# Patient Record
Sex: Female | Born: 1945 | Race: Black or African American | Hispanic: No | Smoking: Never smoker
Health system: Southern US, Community
[De-identification: ages and names within clinical notes are randomized; demographics above are authoritative.]

## PROBLEM LIST (undated history)

## (undated) DIAGNOSIS — D892 Hypergammaglobulinemia, unspecified: Secondary | ICD-10-CM

## (undated) DIAGNOSIS — E7800 Pure hypercholesterolemia, unspecified: Secondary | ICD-10-CM

## (undated) DIAGNOSIS — E079 Disorder of thyroid, unspecified: Secondary | ICD-10-CM

## (undated) DIAGNOSIS — I10 Essential (primary) hypertension: Secondary | ICD-10-CM

## (undated) DIAGNOSIS — K760 Fatty (change of) liver, not elsewhere classified: Secondary | ICD-10-CM

## (undated) DIAGNOSIS — E785 Hyperlipidemia, unspecified: Secondary | ICD-10-CM

## (undated) DIAGNOSIS — E119 Type 2 diabetes mellitus without complications: Secondary | ICD-10-CM

## (undated) DIAGNOSIS — E559 Vitamin D deficiency, unspecified: Secondary | ICD-10-CM

## (undated) DIAGNOSIS — M8580 Other specified disorders of bone density and structure, unspecified site: Secondary | ICD-10-CM

## (undated) DIAGNOSIS — R011 Cardiac murmur, unspecified: Secondary | ICD-10-CM

## (undated) HISTORY — DX: Disorder of thyroid, unspecified: E07.9

## (undated) HISTORY — PX: ANKLE FRACTURE SURGERY: SHX122

---

## 2005-05-09 ENCOUNTER — Emergency Department: Payer: Self-pay | Admitting: Internal Medicine

## 2008-10-15 ENCOUNTER — Ambulatory Visit: Payer: Self-pay

## 2010-11-28 ENCOUNTER — Other Ambulatory Visit: Payer: Self-pay | Admitting: Internal Medicine

## 2011-07-26 ENCOUNTER — Other Ambulatory Visit: Payer: Self-pay | Admitting: Internal Medicine

## 2011-07-26 LAB — ALT: SGPT (ALT): 36 U/L

## 2011-07-26 LAB — SGOT (AST)(ARMC): SGOT(AST): 34 U/L (ref 15–37)

## 2011-08-25 ENCOUNTER — Ambulatory Visit: Payer: Self-pay | Admitting: Internal Medicine

## 2012-03-28 ENCOUNTER — Other Ambulatory Visit: Payer: Self-pay | Admitting: Internal Medicine

## 2012-03-28 LAB — COMPREHENSIVE METABOLIC PANEL
Alkaline Phosphatase: 124 U/L (ref 50–136)
Bilirubin,Total: 0.5 mg/dL (ref 0.2–1.0)
Calcium, Total: 9.2 mg/dL (ref 8.5–10.1)
Chloride: 108 mmol/L — ABNORMAL HIGH (ref 98–107)
Co2: 24 mmol/L (ref 21–32)
Creatinine: 0.78 mg/dL (ref 0.60–1.30)
EGFR (Non-African Amer.): >60
Glucose: 79 mg/dL (ref 65–99)
Osmolality: 279 (ref 275–301)
SGPT (ALT): 30 U/L (ref 12–78)
Sodium: 140 mmol/L (ref 136–145)

## 2012-03-28 LAB — CBC WITH DIFFERENTIAL/PLATELET
Basophil %: 0.7 %
Eosinophil #: 0.1 10*3/uL (ref 0.0–0.7)
Eosinophil %: 0.9 %
HCT: 43.3 % (ref 35.0–47.0)
Lymphocyte #: 2.9 10*3/uL (ref 1.0–3.6)
MCH: 31.1 pg (ref 26.0–34.0)
MCHC: 33.2 g/dL (ref 32.0–36.0)
MCV: 94 fL (ref 80–100)
Monocyte #: 0.6 x10 3/mm (ref 0.2–0.9)
Neutrophil #: 4 10*3/uL (ref 1.4–6.5)
Neutrophil %: 52 %
Platelet: 195 10*3/uL (ref 150–440)
RBC: 4.61 10*6/uL (ref 3.80–5.20)

## 2012-03-28 LAB — LIPID PANEL
HDL Cholesterol: 68 mg/dL — ABNORMAL HIGH (ref 40–60)
Triglycerides: 69 mg/dL (ref 0–200)
VLDL Cholesterol, Calc: 14 mg/dL (ref 5–40)

## 2012-05-24 ENCOUNTER — Ambulatory Visit: Payer: Self-pay | Admitting: Internal Medicine

## 2012-05-24 LAB — COMPREHENSIVE METABOLIC PANEL
Albumin: 3.9 g/dL (ref 3.4–5.0)
Anion Gap: 5 — ABNORMAL LOW (ref 7–16)
BUN: 14 mg/dL (ref 7–18)
Co2: 28 mmol/L (ref 21–32)
EGFR (African American): >60
EGFR (Non-African Amer.): >60
Glucose: 91 mg/dL (ref 65–99)
Osmolality: 279 (ref 275–301)
Potassium: 3.5 mmol/L (ref 3.5–5.1)
Sodium: 140 mmol/L (ref 136–145)
Total Protein: 9.1 g/dL — ABNORMAL HIGH (ref 6.4–8.2)

## 2012-06-07 ENCOUNTER — Other Ambulatory Visit: Payer: Self-pay | Admitting: Internal Medicine

## 2012-06-08 LAB — PROT IMMUNOELECTROPHORES(ARMC)

## 2012-06-08 LAB — URINE IEP, RANDOM

## 2012-06-29 ENCOUNTER — Ambulatory Visit: Payer: Self-pay | Admitting: Oncology

## 2012-07-03 LAB — PROT IMMUNOELECTROPHORES(ARMC)

## 2012-07-18 ENCOUNTER — Ambulatory Visit: Payer: Self-pay | Admitting: Oncology

## 2012-10-11 ENCOUNTER — Other Ambulatory Visit: Payer: Self-pay | Admitting: Internal Medicine

## 2012-10-12 ENCOUNTER — Ambulatory Visit: Payer: Self-pay | Admitting: Oncology

## 2012-10-12 LAB — COMPREHENSIVE METABOLIC PANEL
Albumin: 3.8 g/dL (ref 3.4–5.0)
Alkaline Phosphatase: 100 U/L (ref 50–136)
Anion Gap: 9 (ref 7–16)
BUN: 11 mg/dL (ref 7–18)
Calcium, Total: 9.3 mg/dL (ref 8.5–10.1)
Co2: 25 mmol/L (ref 21–32)
Creatinine: 0.88 mg/dL (ref 0.60–1.30)
EGFR (Non-African Amer.): >60
Glucose: 91 mg/dL (ref 65–99)
Osmolality: 282 (ref 275–301)
Potassium: 3.4 mmol/L — ABNORMAL LOW (ref 3.5–5.1)
SGOT(AST): 34 U/L (ref 15–37)
SGPT (ALT): 38 U/L (ref 12–78)
Sodium: 142 mmol/L (ref 136–145)

## 2012-10-12 LAB — LIPID PANEL
Cholesterol: 148 mg/dL (ref 0–200)
Ldl Cholesterol, Calc: 64 mg/dL (ref 0–100)
Triglycerides: 85 mg/dL (ref 0–200)

## 2012-10-12 LAB — HEMOGLOBIN A1C: Hemoglobin A1C: 7.6 % — ABNORMAL HIGH (ref 4.2–6.3)

## 2013-08-21 ENCOUNTER — Ambulatory Visit: Payer: Self-pay | Admitting: Family Medicine

## 2013-10-01 ENCOUNTER — Ambulatory Visit: Payer: Self-pay | Admitting: Unknown Physician Specialty

## 2013-10-03 LAB — PATHOLOGY REPORT

## 2013-10-17 ENCOUNTER — Ambulatory Visit: Payer: Self-pay | Admitting: Family Medicine

## 2014-05-14 ENCOUNTER — Inpatient Hospital Stay: Payer: Self-pay | Admitting: Orthopedic Surgery

## 2014-05-14 LAB — COMPREHENSIVE METABOLIC PANEL
ALBUMIN: 3.3 g/dL — AB (ref 3.4–5.0)
ALK PHOS: 121 U/L — AB (ref 46–116)
ALT: 56 U/L (ref 14–63)
Anion Gap: 9 (ref 7–16)
BILIRUBIN TOTAL: 0.5 mg/dL (ref 0.2–1.0)
BUN: 14 mg/dL (ref 7–18)
CALCIUM: 9 mg/dL (ref 8.5–10.1)
Chloride: 104 mmol/L (ref 98–107)
Co2: 26 mmol/L (ref 21–32)
Creatinine: 0.94 mg/dL (ref 0.60–1.30)
EGFR (African American): >60
EGFR (Non-African Amer.): >60
GLUCOSE: 299 mg/dL — AB (ref 65–99)
OSMOLALITY: 289 (ref 275–301)
POTASSIUM: 4 mmol/L (ref 3.5–5.1)
SGOT(AST): 71 U/L — ABNORMAL HIGH (ref 15–37)
SODIUM: 139 mmol/L (ref 136–145)
Total Protein: 8.4 g/dL — ABNORMAL HIGH (ref 6.4–8.2)

## 2014-05-14 LAB — APTT: ACTIVATED PTT: 24.8 s (ref 23.6–35.9)

## 2014-05-14 LAB — PROTIME-INR
INR: 1
Prothrombin Time: 13 secs (ref 11.5–14.7)

## 2014-05-14 LAB — CBC
HCT: 42.8 % (ref 35.0–47.0)
HGB: 14 g/dL (ref 12.0–16.0)
MCH: 30.3 pg (ref 26.0–34.0)
MCHC: 32.7 g/dL (ref 32.0–36.0)
MCV: 93 fL (ref 80–100)
PLATELETS: 161 10*3/uL (ref 150–440)
RBC: 4.62 10*6/uL (ref 3.80–5.20)
RDW: 13.8 % (ref 11.5–14.5)
WBC: 7.2 10*3/uL (ref 3.6–11.0)

## 2014-05-14 LAB — MAGNESIUM: MAGNESIUM: 1.7 mg/dL — AB

## 2014-05-14 LAB — LIPASE, BLOOD: Lipase: 140 U/L (ref 73–393)

## 2014-05-15 LAB — CBC WITH DIFFERENTIAL/PLATELET
Basophil #: 0 10*3/uL (ref 0.0–0.1)
Basophil %: 0.3 %
EOS ABS: 0.1 10*3/uL (ref 0.0–0.7)
EOS PCT: 1.1 %
HCT: 36 % (ref 35.0–47.0)
HGB: 12 g/dL (ref 12.0–16.0)
LYMPHS PCT: 28.1 %
Lymphocyte #: 2.3 10*3/uL (ref 1.0–3.6)
MCH: 31 pg (ref 26.0–34.0)
MCHC: 33.3 g/dL (ref 32.0–36.0)
MCV: 93 fL (ref 80–100)
Monocyte #: 0.9 x10 3/mm (ref 0.2–0.9)
Monocyte %: 10.9 %
NEUTROS ABS: 4.9 10*3/uL (ref 1.4–6.5)
NEUTROS PCT: 59.6 %
Platelet: 140 10*3/uL — ABNORMAL LOW (ref 150–440)
RBC: 3.87 10*6/uL (ref 3.80–5.20)
RDW: 14 % (ref 11.5–14.5)
WBC: 8.2 10*3/uL (ref 3.6–11.0)

## 2014-05-15 LAB — BASIC METABOLIC PANEL
Anion Gap: 8 (ref 7–16)
BUN: 11 mg/dL (ref 7–18)
CALCIUM: 8 mg/dL — AB (ref 8.5–10.1)
Chloride: 108 mmol/L — ABNORMAL HIGH (ref 98–107)
Co2: 25 mmol/L (ref 21–32)
Creatinine: 0.97 mg/dL (ref 0.60–1.30)
Glucose: 228 mg/dL — ABNORMAL HIGH (ref 65–99)
Osmolality: 288 (ref 275–301)
Potassium: 3.5 mmol/L (ref 3.5–5.1)
Sodium: 141 mmol/L (ref 136–145)

## 2014-08-18 NOTE — Discharge Summary (Signed)
PATIENT NAME:  [name] MR#:  [id number] DATE OF BIRTH:  09-Sep-1945  DATE OF ADMISSION:  05/14/2014 DATE OF DISCHARGE:  05/16/2014  ADMITTING DIAGNOSIS: Left bimalleolar ankle fracture dislocation.   DISCHARGE DIAGNOSIS: Left bimalleolar ankle fracture dislocation.   OPERATION: On 05/14/2014, the patient had an ORIF of a left ankle bimalleolar fracture.   SURGEON: Laurene Footman, MD   ANESTHESIA: Spinal.   ESTIMATED BLOOD LOSS: 25 mL.   IMPLANTS USED: Synthes one-third tubular plate with multiple cortical screws, 4.0 cannulated screws medially. The patient was stabilized, brought to the recovery room, then brought down to the orthopedic floor.   HISTORY OF PRESENT ILLNESS: The patient is a 69 year old female that presented after falling at work and being unable to put pressure on that leg. The patient went to the Emergency Room, where x-rays and a CT scan were ordered, revealing a bimalleolar ankle fracture dislocation.   PHYSICAL EXAMINATION: GENERAL: Alert female, in mild discomfort.  LUNGS: Clear.  HEART: Regular rate and rhythm.  MUSCULOSKELETAL: In regard to the left leg, the patient is in a splint with palpable pulse and sensation intact. X-rays revealed a syndesmosis rupture with bimalleolar ankle fracture. The patient was stabilized and had surgery that same day. The patient was then brought to the orthopedic floor.   HOSPITAL COURSE: After initial admission, the patient was brought to the orthopedic floor after surgery. The patient worked with physical therapy, initially ambulating 40 feet and able to walk around with a walker, doing toe-touch weight-bear on the affected leg. The patient was ready to go home, on 05/16/2014, with family.   CONDITION AT DISCHARGE: Stable.   DISPOSITION: The patient was sent home.   DISCHARGE INSTRUCTIONS: The patient will do toe-touch weight-bear with a walker. The patient does have a diabetic diet. The patient will use ice pack to  decrease swelling and try to keep her leg elevated, as well as to keep her dressing on from surgery. The patient will call the clinic if there is any bright red bleeding, calf pain, bowel or bladder difficulty, or any fever greater than 101.5. The patient will follow up in 3-5 days for dressing change.   DISCHARGE MEDICATIONS: To resume home medication and to add oxycodone 1 tablet every 4 hours as needed for severe pain and aspirin 325 mg 1 tablet daily.    ____________________________ Lenna Sciara. Reche Dixon, [place] [email or web address]:[id number] D: 05/16/2014 06:30:27 ET T: 05/16/2014 11:41:47 ET JOB#: [id number]  cc: J. Reche Dixon, [place], <Dictator> J Kai Cal[facility] PA ELECTRONICALLY SIGNED 05/17/2014 10:21

## 2014-08-18 NOTE — H&P (Signed)
PATIENT NAME:  [name], LAROCHE MR#:  [id number] DATE OF BIRTH:  09/16/1945  DATE OF ADMISSION:  05/14/2014  CHIEF COMPLAINT: Severe left ankle pain.   HISTORY OF PRESENT ILLNESS: The patient is a 69 year old, who fell at work. She works at [employer]. She suffered a twisting injury to her left ankle and had immediate pain and deformity. She was brought to the Emergency Room where x-rays were obtained. The fracture was reduced on a CT scan taken followup. She is being admitted for treatment of this.   PAST MEDICAL HISTORY: Remarkable for insulin-dependent diabetes for about 10 years, as well as hypertension and thyroid disease.   MEDICATIONS ON ADMISSION: Novolin 70/30 at 30 units subcutaneously b.i.d. with meals, hydrochlorothiazide 25 daily, glipizide extended release 10 mg daily.   ALLERGIES: ACE INHIBITOR.   SOCIAL HISTORY: She is a nonsmoker and independent.   REVIEW OF SYSTEMS: Negative for chest pain or loss of consciousness.   PHYSICAL EXAMINATION:  GENERAL: Well developed, well nourished black female, who appears younger than her stated age, in mild distress.  HEENT: Remarkable for a partial denture not currently in.  LUNGS: Clear.  HEART: Regular rate and rhythm. ABDOMEN: Soft, nontender.  EXTREMITIES: Remarkable for the left leg in a splint. She has a palpable dorsalis pedis pulse through the splint. Sensation is intact to the foot with brisk capillary refill.   RADIOLOGIC DATA: X-rays were reviewed and they show a syndesmosis rupture with a distal fibular shaft fracture, as well as an avulsed medial malleolus fracture.   IMPRESSION: Bimalleolar fracture-dislocation with good initial reduction by the Emergency Room staff.   PLAN: The plan is for definitive ORIF. The risks, benefits, and possible complications were discussed with the patient.   ____________________________ Laurene Footman, MD mjm:JT D: 05/14/2014 12:18:20 ET T: 05/14/2014 12:32:46  ET JOB#: [id number]  cc: Laurene Footman, MD, <Dictator>  Laurene Footman MD ELECTRONICALLY SIGNED 05/14/2014 14:39

## 2014-08-18 NOTE — Op Note (Signed)
PATIENT NAME:  [name], FUENTE MR#:  [id number] DATE OF BIRTH:  26-Feb-1946  DATE OF PROCEDURE:  05/14/2014  PREOPERATIVE DIAGNOSIS:  Left bimalleolar ankle fracture-dislocation.   POSTOPERATIVE DIAGNOSIS:  Left bimalleolar ankle fracture-dislocation.   PROCEDURE:  Open reduction and internal fixation of distal fibula, syndesmosis, and medial malleolus.   ANESTHESIA:  Spinal.   SURGEON:  Laurene Footman, MD   DESCRIPTION OF PROCEDURE:  The patient was brought to the operating room, and after adequate anesthesia was obtained, the left leg was prepped and draped in the usual sterile fashion with a bump underneath the left buttock. A tourniquet was applied to the upper thigh. After prepping and draping in the usual sterile fashion, appropriate patient identification and timeout procedures were completed, and the tourniquet was raised to 300 mmHg. A lateral approach was made to the fibula, the fibula exposed, fracture hematoma evacuated, and there was a large amount of hematoma present in the syndesmosis. This was irrigated. The fracture was reduced anatomically using a reduction clamp and an anterior-to-posterior lag screw was inserted, over-drilling the proximal cortex and placing a cortical screw. This gave anatomic fixation of the distal fibula. A 6-hole one-third tubular plate was then used to neutralize this with 3 proximal cortical screws, 2 distal cortical screws, and 1 syndesmotic screw going across 4 cortices. This gave anatomic alignment to the ankle mortise. To stress views, the mortise was intact. The wound was irrigated, and then the anterior medial approach was made to the medial malleolus. There was significant soft tissue stripping off the periosteum anteriorly. The wound was irrigated, but no loose fragments were identified. The fracture was reduced and held in a reduced position with a dental pick as a single threaded K wire was placed, drilled, and then a 4.0 cannulated screw was placed  over this 34 mm in length. This gave good compression of the fracture with anatomic alignment. The wounds were irrigated and closed with 3-0 Vicryl subcutaneously and skin staples. Xeroform, 4 x 4's, Webril, stirrup splint, and Ace wrap were applied. The tourniquet was let down at the close of the case.   TOURNIQUET TIME:  42 minutes.   ESTIMATED BLOOD LOSS:  25 mL.   COMPLICATIONS:  None.   SPECIMEN:  None.   IMPLANTS:  Synthes one-third tubular plate with multiple cortical screws, 4.0 cannulated screws medially.    ____________________________ Laurene Footman, MD mjm:nb D: 05/14/2014 21:30:54 ET T: 05/14/2014 23:51:42 ET JOB#: [id number]  cc: Laurene Footman, MD, <Dictator> Laurene Footman MD ELECTRONICALLY SIGNED 05/15/2014 7:16

## 2014-08-22 ENCOUNTER — Other Ambulatory Visit: Payer: Self-pay | Admitting: Family Medicine

## 2014-08-22 DIAGNOSIS — Z780 Asymptomatic menopausal state: Secondary | ICD-10-CM

## 2014-09-11 ENCOUNTER — Ambulatory Visit
Admission: RE | Admit: 2014-09-11 | Discharge: 2014-09-11 | Disposition: A | Discharge status: Home / Self Care | Payer: Medicare Other | Source: Ambulatory Visit | Attending: Family Medicine | Admitting: Family Medicine

## 2014-09-11 DIAGNOSIS — Z780 Asymptomatic menopausal state: Secondary | ICD-10-CM | POA: Diagnosis present

## 2014-09-11 DIAGNOSIS — M8580 Other specified disorders of bone density and structure, unspecified site: Secondary | ICD-10-CM | POA: Diagnosis not present

## 2014-09-12 ENCOUNTER — Encounter
Admission: RE | Admit: 2014-09-12 | Discharge: 2014-09-12 | Disposition: A | Discharge status: Home / Self Care | Payer: Worker's Compensation | Source: Ambulatory Visit | Attending: Orthopedic Surgery | Admitting: Orthopedic Surgery

## 2014-09-12 DIAGNOSIS — M25572 Pain in left ankle and joints of left foot: Secondary | ICD-10-CM | POA: Insufficient documentation

## 2014-09-12 DIAGNOSIS — Z01810 Encounter for preprocedural cardiovascular examination: Secondary | ICD-10-CM | POA: Diagnosis not present

## 2014-09-12 DIAGNOSIS — Z01812 Encounter for preprocedural laboratory examination: Secondary | ICD-10-CM | POA: Insufficient documentation

## 2014-09-12 HISTORY — DX: Essential (primary) hypertension: I10

## 2014-09-12 HISTORY — DX: Type 2 diabetes mellitus without complications: E11.9

## 2014-09-12 HISTORY — DX: Hyperlipidemia, unspecified: E78.5

## 2014-09-12 LAB — CBC
HCT: 42.5 % (ref 35.0–47.0)
Hemoglobin: 14 g/dL (ref 12.0–16.0)
MCH: 30.1 pg (ref 26.0–34.0)
MCHC: 32.9 g/dL (ref 32.0–36.0)
MCV: 91.5 fL (ref 80.0–100.0)
PLATELETS: 171 10*3/uL (ref 150–440)
RBC: 4.65 MIL/uL (ref 3.80–5.20)
RDW: 14.5 % (ref 11.5–14.5)
WBC: 6.8 10*3/uL (ref 3.6–11.0)

## 2014-09-12 LAB — BASIC METABOLIC PANEL
ANION GAP: 9 (ref 5–15)
BUN: 12 mg/dL (ref 6–20)
CO2: 26 mmol/L (ref 22–32)
Calcium: 9.5 mg/dL (ref 8.9–10.3)
Chloride: 106 mmol/L (ref 101–111)
Creatinine, Ser: 0.92 mg/dL (ref 0.44–1.00)
GFR calc Af Amer: >60 mL/min (ref >60)
Glucose, Bld: 222 mg/dL — ABNORMAL HIGH (ref 65–99)
Potassium: 3.9 mmol/L (ref 3.5–5.1)
SODIUM: 141 mmol/L (ref 135–145)

## 2014-09-12 NOTE — Patient Instructions (Signed)
  Your procedure is scheduled on: Tuesday 09/17/2014 Report to Day Surgery. Enter through the [facility] To find out your arrival time please call [phone] between 1PM - 3PM on Friday 09/13/14.  Remember: Instructions that are not followed completely may result in serious medical risk, up to and including death, or upon the discretion of your surgeon and anesthesiologist your surgery may need to be rescheduled.    __x__ 1. Do not eat food or drink liquids after midnight. No gum chewing or hard candies.     __x__ 2. No Alcohol for 24 hours before or after surgery.   ____ 3. Bring all medications with you on the day of surgery if instructed.    __x__ 4. Notify your doctor if there is any change in your medical condition     (cold, fever, infections).     Do not wear jewelry, make-up, hairpins, clips or nail polish.  Do not wear lotions, powders, or perfumes. You may wear deodorant.  Do not shave 48 hours prior to surgery. Men may shave face and neck.  Do not bring valuables to the hospital.    [facility] is not responsible for any belongings or valuables.               Contacts, dentures or bridgework may not be worn into surgery.  Leave your suitcase in the car. After surgery it may be brought to your room.  For patients admitted to the hospital, discharge time is determined by your                treatment team.   Patients discharged the day of surgery will not be allowed to drive home.   Please read over the following fact sheets that you were given:   Surgical Site Infection Prevention   __x__ Take these medicines the morning of surgery with A SIP OF WATER:    1.  2.   3.   4.  5.  6.  ____ Fleet Enema (as directed)   __x__ Use CHG Soap as directed  ____ Use inhalers on the day of surgery  ____ Stop metformin 2 days prior to surgery    __x__ Take 1/2 of usual insulin dose the night before surgery and none on the morning of surgery.   __x__ Stop  Coumadin/Plavix/aspirin on     today  ____ Stop Anti-inflammatories on    ____ Stop supplements until after surgery.    ____ Bring C-Pap to the hospital.

## 2014-09-17 ENCOUNTER — Encounter
Admission: RE | Disposition: A | Discharge status: Home / Self Care | Payer: Self-pay | Source: Ambulatory Visit | Attending: Orthopedic Surgery

## 2014-09-17 ENCOUNTER — Ambulatory Visit: Payer: Worker's Compensation | Admitting: Anesthesiology

## 2014-09-17 ENCOUNTER — Ambulatory Visit
Admission: RE | Admit: 2014-09-17 | Discharge: 2014-09-17 | Disposition: A | Discharge status: Home / Self Care | Payer: Worker's Compensation | Source: Ambulatory Visit | Attending: Orthopedic Surgery | Admitting: Orthopedic Surgery

## 2014-09-17 ENCOUNTER — Encounter: Payer: Self-pay | Admitting: Anesthesiology

## 2014-09-17 DIAGNOSIS — I10 Essential (primary) hypertension: Secondary | ICD-10-CM | POA: Insufficient documentation

## 2014-09-17 DIAGNOSIS — Z79891 Long term (current) use of opiate analgesic: Secondary | ICD-10-CM | POA: Diagnosis not present

## 2014-09-17 DIAGNOSIS — Z794 Long term (current) use of insulin: Secondary | ICD-10-CM | POA: Diagnosis not present

## 2014-09-17 DIAGNOSIS — Z888 Allergy status to other drugs, medicaments and biological substances status: Secondary | ICD-10-CM | POA: Insufficient documentation

## 2014-09-17 DIAGNOSIS — Z79899 Other long term (current) drug therapy: Secondary | ICD-10-CM | POA: Insufficient documentation

## 2014-09-17 DIAGNOSIS — Z7982 Long term (current) use of aspirin: Secondary | ICD-10-CM | POA: Insufficient documentation

## 2014-09-17 DIAGNOSIS — T8484XA Pain due to internal orthopedic prosthetic devices, implants and grafts, initial encounter: Secondary | ICD-10-CM | POA: Diagnosis present

## 2014-09-17 DIAGNOSIS — E119 Type 2 diabetes mellitus without complications: Secondary | ICD-10-CM | POA: Insufficient documentation

## 2014-09-17 DIAGNOSIS — E785 Hyperlipidemia, unspecified: Secondary | ICD-10-CM | POA: Insufficient documentation

## 2014-09-17 DIAGNOSIS — R011 Cardiac murmur, unspecified: Secondary | ICD-10-CM | POA: Diagnosis not present

## 2014-09-17 DIAGNOSIS — R413 Other amnesia: Secondary | ICD-10-CM | POA: Diagnosis not present

## 2014-09-17 HISTORY — PX: HARDWARE REMOVAL: SHX979

## 2014-09-17 LAB — GLUCOSE, CAPILLARY
GLUCOSE-CAPILLARY: 149 mg/dL — AB (ref 65–99)
Glucose-Capillary: 133 mg/dL — ABNORMAL HIGH (ref 65–99)

## 2014-09-17 SURGERY — REMOVAL, HARDWARE
Anesthesia: General | Laterality: Left | Wound class: Clean Contaminated

## 2014-09-17 MED ORDER — ONDANSETRON HCL 4 MG/2ML IJ SOLN
INTRAMUSCULAR | Status: AC
  Filled 2014-09-17: qty 2

## 2014-09-17 MED ORDER — HYDROCODONE-ACETAMINOPHEN 5-325 MG PO TABS: 1.0000 | ORAL_TABLET | ORAL | Status: DC | PRN

## 2014-09-17 MED ORDER — PROMETHAZINE HCL 25 MG/ML IJ SOLN: 6.2500 mg | Freq: Once | INTRAMUSCULAR | Status: DC

## 2014-09-17 MED ORDER — HYDROMORPHONE HCL 1 MG/ML IJ SOLN
INTRAMUSCULAR | Status: AC
Start: 2014-09-17 — End: 2014-09-17
  Administered 2014-09-17: 0.5 mg via INTRAVENOUS
  Filled 2014-09-17: qty 1

## 2014-09-17 MED ORDER — HYDROMORPHONE HCL 1 MG/ML IJ SOLN: 0.5000 mg | INTRAMUSCULAR | Status: DC | PRN

## 2014-09-17 MED ORDER — FENTANYL CITRATE (PF) 100 MCG/2ML IJ SOLN
INTRAMUSCULAR | Status: AC
  Administered 2014-09-17: 25 ug via INTRAVENOUS
  Filled 2014-09-17: qty 2

## 2014-09-17 MED ORDER — NEOMYCIN-POLYMYXIN B GU 40-200000 IR SOLN
Status: AC
  Filled 2014-09-17: qty 4

## 2014-09-17 MED ORDER — PROMETHAZINE HCL 25 MG/ML IJ SOLN
INTRAMUSCULAR | Status: AC
  Administered 2014-09-17: 6.25 mg via INTRAVENOUS
  Filled 2014-09-17: qty 1

## 2014-09-17 MED ORDER — LACTATED RINGERS IV SOLN
INTRAVENOUS | Status: DC | PRN
  Administered 2014-09-17: 12:00:00
  Administered 2014-09-17: 10:00:00 via INTRAVENOUS

## 2014-09-17 MED ORDER — GLYCOPYRROLATE 0.2 MG/ML IJ SOLN
INTRAMUSCULAR | Status: DC | PRN
  Administered 2014-09-17: 0.2 mg via INTRAVENOUS

## 2014-09-17 MED ORDER — HYDROMORPHONE HCL 1 MG/ML IJ SOLN
0.2500 mg | INTRAMUSCULAR | Status: DC | PRN
  Administered 2014-09-17 (×2): 0.5 mg via INTRAVENOUS

## 2014-09-17 MED ORDER — ONDANSETRON HCL 4 MG PO TABS: 4.0000 mg | ORAL_TABLET | Freq: Four times a day (QID) | ORAL | Status: DC | PRN

## 2014-09-17 MED ORDER — HYDROMORPHONE HCL 1 MG/ML IJ SOLN
INTRAMUSCULAR | Status: AC
  Filled 2014-09-17: qty 1

## 2014-09-17 MED ORDER — LIDOCAINE HCL (CARDIAC) 20 MG/ML IV SOLN
INTRAVENOUS | Status: DC | PRN
  Administered 2014-09-17: 50 mg via INTRAVENOUS

## 2014-09-17 MED ORDER — CEFAZOLIN SODIUM-DEXTROSE 2-3 GM-% IV SOLR
INTRAVENOUS | Status: DC | PRN
  Administered 2014-09-17: 2 g via INTRAVENOUS

## 2014-09-17 MED ORDER — FENTANYL CITRATE (PF) 100 MCG/2ML IJ SOLN
25.0000 ug | Freq: Once | INTRAMUSCULAR | Status: AC
  Administered 2014-09-17: 25 ug via INTRAVENOUS

## 2014-09-17 MED ORDER — FENTANYL CITRATE (PF) 100 MCG/2ML IJ SOLN
INTRAMUSCULAR | Status: DC | PRN
  Administered 2014-09-17 (×2): 50 ug via INTRAVENOUS

## 2014-09-17 MED ORDER — SODIUM CHLORIDE 0.9 % IV SOLN
INTRAVENOUS | Status: DC
  Administered 2014-09-17: 1000 mL via INTRAVENOUS

## 2014-09-17 MED ORDER — PROMETHAZINE HCL 25 MG/ML IJ SOLN
6.2500 mg | Freq: Once | INTRAMUSCULAR | Status: AC
  Administered 2014-09-17: 6.25 mg via INTRAVENOUS

## 2014-09-17 MED ORDER — ONDANSETRON HCL 4 MG/2ML IJ SOLN
4.0000 mg | Freq: Once | INTRAMUSCULAR | Status: AC | PRN
  Administered 2014-09-17: 4 mg via INTRAVENOUS

## 2014-09-17 MED ORDER — CLINDAMYCIN HCL 300 MG PO CAPS: 300.0000 mg | ORAL_CAPSULE | Freq: Three times a day (TID) | ORAL | Status: DC

## 2014-09-17 MED ORDER — HYDROCODONE-ACETAMINOPHEN 5-325 MG PO TABS: 1.0000 | ORAL_TABLET | Freq: Four times a day (QID) | ORAL | Status: DC | PRN

## 2014-09-17 MED ORDER — ONDANSETRON HCL 4 MG/2ML IJ SOLN
INTRAMUSCULAR | Status: DC | PRN
  Administered 2014-09-17: 4 mg via INTRAVENOUS

## 2014-09-17 MED ORDER — SODIUM CHLORIDE 0.9 % IV SOLN
INTRAVENOUS | Status: DC
  Administered 2014-09-17: 14:00:00 via INTRAVENOUS

## 2014-09-17 MED ORDER — NEOMYCIN-POLYMYXIN B GU 40-200000 IR SOLN
Status: DC | PRN
  Administered 2014-09-17: 2 mL

## 2014-09-17 MED ORDER — ONDANSETRON HCL 4 MG/2ML IJ SOLN: 4.0000 mg | Freq: Four times a day (QID) | INTRAMUSCULAR | Status: DC | PRN

## 2014-09-17 MED ORDER — PROPOFOL 10 MG/ML IV BOLUS
INTRAVENOUS | Status: DC | PRN
  Administered 2014-09-17: 170 mg via INTRAVENOUS

## 2014-09-17 MED ORDER — FENTANYL CITRATE (PF) 100 MCG/2ML IJ SOLN
25.0000 ug | INTRAMUSCULAR | Status: AC | PRN
  Administered 2014-09-17 (×7): 25 ug via INTRAVENOUS

## 2014-09-17 SURGICAL SUPPLY — 35 items
BNDG COHESIVE 4X5 TAN STRL (GAUZE/BANDAGES/DRESSINGS) IMPLANT
CANISTER SUCT 1200ML W/VALVE (MISCELLANEOUS) ×2 IMPLANT
CHLORAPREP W/TINT 26ML (MISCELLANEOUS) ×2 IMPLANT
DRAPE C-ARM XRAY 36X54 (DRAPES) ×2 IMPLANT
DRAPE INCISE IOBAN 66X45 STRL (DRAPES) ×2 IMPLANT
DRSG EMULSION OIL 3X8 NADH (GAUZE/BANDAGES/DRESSINGS) IMPLANT
ELECT CAUTERY BLADE 6.4 (BLADE) ×2 IMPLANT
GAUZE PETRO XEROFOAM 1X8 (MISCELLANEOUS) ×2 IMPLANT
GAUZE SPONGE 4X4 12PLY STRL (GAUZE/BANDAGES/DRESSINGS) ×2 IMPLANT
GLOVE BIOGEL PI IND STRL 9 (GLOVE) ×1 IMPLANT
GLOVE BIOGEL PI INDICATOR 9 (GLOVE) ×1
GLOVE SURG ORTHO 9.0 STRL STRW (GLOVE) ×2 IMPLANT
GOWN SPECIALTY ULTRA XL (MISCELLANEOUS) ×2 IMPLANT
GOWN STRL REUS W/ TWL LRG LVL3 (GOWN DISPOSABLE) ×1 IMPLANT
GOWN STRL REUS W/TWL LRG LVL3 (GOWN DISPOSABLE) ×1
GOWN STRL REUS W/TWL XL LVL4 (GOWN DISPOSABLE) ×2 IMPLANT
KIT RM TURNOVER STRD PROC AR (KITS) ×2 IMPLANT
NEEDLE FILTER BLUNT 18X 1/2SAF (NEEDLE) ×1
NEEDLE FILTER BLUNT 18X1 1/2 (NEEDLE) ×1 IMPLANT
NS IRRIG 1000ML POUR BTL (IV SOLUTION) ×2 IMPLANT
PACK HIP COMPR (MISCELLANEOUS) IMPLANT
PAD ABD DERMACEA PRESS 5X9 (GAUZE/BANDAGES/DRESSINGS) ×4 IMPLANT
PAD GROUND ADULT SPLIT (MISCELLANEOUS) IMPLANT
PREP PVP WINGED SPONGE (MISCELLANEOUS) ×2 IMPLANT
STAPLER SKIN PROX 35W (STAPLE) ×2 IMPLANT
STOCKINETTE M/LG 89821 (MISCELLANEOUS) IMPLANT
SUT ETHIBOND NAB CT1 #1 30IN (SUTURE) IMPLANT
SUT ETHILON 3-0 FS-10 30 BLK (SUTURE)
SUT VIC AB 0 CT1 36 (SUTURE) IMPLANT
SUT VIC AB 1 CTX 27 (SUTURE) IMPLANT
SUT VIC AB 2-0 CT1 27 (SUTURE) ×1
SUT VIC AB 2-0 CT1 TAPERPNT 27 (SUTURE) ×1 IMPLANT
SUTURE EHLN 3-0 FS-10 30 BLK (SUTURE) IMPLANT
SYRINGE 10CC LL (SYRINGE) ×2 IMPLANT
WATER STERILE IRR 1000ML POUR (IV SOLUTION) ×2 IMPLANT

## 2014-09-17 NOTE — Anesthesia Postprocedure Evaluation (Signed)
  Anesthesia Post-op Note  Patient: [name]  Procedure(s) Performed: Procedure(s): HARDWARE REMOVAL/ANKLE (Left)  Anesthesia type:General  Patient location: PACU  Post pain: Pain level controlled  Post assessment: Post-op Vital signs reviewed, Patient's Cardiovascular Status Stable, Respiratory Function Stable, Patent Airway and No signs of Nausea or vomiting  Post vital signs: Reviewed and stable  Last Vitals:  Filed Vitals:   09/17/14 1412  BP: 138/57  Pulse: 91  Temp:   Resp: 16    Level of consciousness: awake, alert  and patient cooperative  Complications: No apparent anesthesia complications

## 2014-09-17 NOTE — Transfer of Care (Signed)
Immediate Anesthesia Transfer of Care Note  Patient: [name]  Procedure(s) Performed: Procedure(s): HARDWARE REMOVAL/ANKLE (Left)  Patient Location: PACU  Anesthesia Type:General  Level of Consciousness: Alert, Awake, Oriented  Airway & Oxygen Therapy: Patient Spontanous Breathing  Post-op Assessment: Report given to RN  Post vital signs: Reviewed and stable  Last Vitals:  Filed Vitals:   09/17/14 1101  BP: 136/91  Pulse: 112  Temp: 36.1 C  Resp: 16    Complications: No apparent anesthesia complications

## 2014-09-17 NOTE — Progress Notes (Signed)
Dr. Kayleen Memos notified of patient's continued nausea and vomiting. New order for Phenergan IV. Given as per order.

## 2014-09-17 NOTE — Progress Notes (Signed)
Given Zofran IV for nausea and vomiting at 1300. Had another 100 ml emesis at 1330. Resting quietly with eyes closed at this time. Family at bedside. Will continue to monitor. VSS.

## 2014-09-17 NOTE — Pre-Procedure Instructions (Signed)
Pt nausea and vomiting improving, pt wants to go home.

## 2014-09-17 NOTE — Anesthesia Preprocedure Evaluation (Addendum)
Anesthesia Evaluation  Patient identified by MRN, date of birth, ID band Patient awake    Reviewed: Allergy & Precautions, NPO status , Patient's Chart, lab work & pertinent test results  Airway Mallampati: II  TM Distance: >3 FB Neck ROM: Full    Dental no notable dental hx.    Pulmonary pneumonia -, resolved,  breath sounds clear to auscultation  Pulmonary exam normal       Cardiovascular hypertension, Normal cardiovascular examRhythm:Regular Rate:Normal     Neuro/Psych negative neurological ROS  negative psych ROS   GI/Hepatic negative GI ROS, Neg liver ROS,   Endo/Other  diabetes, Well Controlled, Type 2, Oral Hypoglycemic Agents, Insulin Dependent  Renal/GU negative Renal ROS  negative genitourinary   Musculoskeletal negative musculoskeletal ROS (+)   Abdominal Normal abdominal exam  (+)   Peds negative pediatric ROS (+)  Hematology negative hematology ROS (+)   Anesthesia Other Findings   Reproductive/Obstetrics negative OB ROS                           Anesthesia Physical Anesthesia Plan  ASA: II  Anesthesia Plan: General   Post-op Pain Management:    Induction: Intravenous  Airway Management Planned: LMA  Additional Equipment:   Intra-op Plan:   Post-operative Plan: Extubation in OR  Informed Consent: I have reviewed the patients History and Physical, chart, labs and discussed the procedure including the risks, benefits and alternatives for the proposed anesthesia with the patient or authorized representative who has indicated his/her understanding and acceptance.   Dental advisory given  Plan Discussed with: CRNA and Surgeon  Anesthesia Plan Comments:         Anesthesia Quick Evaluation

## 2014-09-17 NOTE — Discharge Instructions (Signed)
AMBULATORY SURGERY  DISCHARGE INSTRUCTIONS   1) The drugs that you were given will stay in your system until tomorrow so for the next 24 hours you should not:  A) Drive an automobile B) Make any legal decisions C) Drink any alcoholic beverage   2) You may resume regular meals tomorrow.  Today it is better to start with liquids and gradually work up to solid foods.  You may eat anything you prefer, but it is better to start with liquids, then soup and crackers, and gradually work up to solid foods.   3) Please notify your doctor immediately if you have any unusual bleeding, trouble breathing, redness and pain at the surgery site, drainage, fever, or pain not relieved by medication.  4) Elevate left ankle at all times  5) Apply ice pack to left ankle intermittently to help reduce pain and swelling        6)  Weight bearing as tolerated        7) Script for Norco and Clindamycin was given to you today. Take as directed.

## 2014-09-17 NOTE — H&P (Signed)
Reviewed paper H+P, will be scanned into chart. No changes noted.  

## 2014-09-17 NOTE — Op Note (Signed)
09/17/2014  11:04 AM  PATIENT:  [name]  69 y.o. female  PRE-OPERATIVE DIAGNOSIS:  PAINFUL LEFT HARDWARE  POST-OPERATIVE DIAGNOSIS:  PAINFUL LEFT HARDWARE  PROCEDURE:  Procedure(s): HARDWARE REMOVAL/ANKLE (Left)  SURGEON: Laurene Footman, MD  ASSISTANTS: None  ANESTHESIA:   general  EBL:  Total I/O In: 700 [I.V.:700] Out: -   BLOOD ADMINISTERED:none  DRAINS: none   LOCAL MEDICATIONS USED:  NONE  SPECIMEN:  Source of Specimen:  Culture left lateral ankle  DISPOSITION OF SPECIMEN:  PATHOLOGY  COUNTS:  YES  TOURNIQUET:  * No tourniquets in log *   19 minutes at 300 mmHg  IMPLANTS: None  DICTATION: .Dragon Dictation patient brought the operating room, after dry anesthesia was obtained the left leg was prepped and draped in sterile fashion. After patient identification and timeout procedures were completed, the lateral ankle and incision was opened through this prior scar. On initial incision there was some drainage present that the purulent and culture was obtained of this the plate was exposed and there was fluid overlying this which was bloody and not grossly purulent. After exposure of the plate and the anterior posterior screw all screws removed without difficulty the wound was then irrigated with pulsatile lavage. And closed with skin staples. The medial incision was then opened and the 40 screw identified and removed without difficulty. The ankle was stressed under fluoroscopic views and the mortise was intact. The wounds were closed with staples and covered with Xeroform 4 x 4's web roll and Ace wrap  PLAN OF CARE: Discharge to home after PACU  PATIENT DISPOSITION:  PACU - hemodynamically stable.

## 2014-09-17 NOTE — Anesthesia Procedure Notes (Signed)
Procedure Name: LMA Insertion Date/Time: 09/17/2014 10:19 AM Performed by: Eliberto Ivory Pre-anesthesia Checklist: Patient identified, Patient being monitored, Timeout performed, Emergency Drugs available and Suction available Patient Re-evaluated:Patient Re-evaluated prior to inductionOxygen Delivery Method: Circle system utilized Preoxygenation: Pre-oxygenation with 100% oxygen Intubation Type: IV induction Ventilation: Mask ventilation without difficulty LMA: LMA inserted LMA Size: 3.0 Tube type: Oral Number of attempts: 1 Placement Confirmation: positive ETCO2 and breath sounds checked- equal and bilateral Tube secured with: Tape Dental Injury: Teeth and Oropharynx as per pre-operative assessment

## 2014-09-20 LAB — WOUND CULTURE

## 2014-09-21 LAB — ANAEROBIC CULTURE

## 2015-03-28 ENCOUNTER — Other Ambulatory Visit: Payer: Self-pay | Admitting: Family Medicine

## 2015-03-28 DIAGNOSIS — Z1231 Encounter for screening mammogram for malignant neoplasm of breast: Secondary | ICD-10-CM

## 2015-04-01 ENCOUNTER — Ambulatory Visit
Admission: RE | Admit: 2015-04-01 | Discharge: 2015-04-01 | Disposition: A | Discharge status: Home / Self Care | Payer: PRIVATE HEALTH INSURANCE | Source: Ambulatory Visit | Attending: Family Medicine | Admitting: Family Medicine

## 2015-04-01 DIAGNOSIS — Z1231 Encounter for screening mammogram for malignant neoplasm of breast: Secondary | ICD-10-CM | POA: Diagnosis not present

## 2015-04-15 ENCOUNTER — Encounter: Payer: PRIVATE HEALTH INSURANCE | Attending: Family Medicine | Admitting: Dietician

## 2015-04-15 ENCOUNTER — Encounter: Payer: Self-pay | Admitting: Dietician

## 2015-04-15 VITALS — BP 137/63 | Ht 62.0 in | Wt 163.5 lb

## 2015-04-15 DIAGNOSIS — E119 Type 2 diabetes mellitus without complications: Secondary | ICD-10-CM | POA: Diagnosis present

## 2015-04-15 NOTE — Patient Instructions (Signed)
Check blood sugars 2 x day before breakfast and before supper every day and some 2 hr pp supper Exercise:  Try walking 10-15 min    3  days a week Avoid sugar sweetened drinks (soda, tea, coffee, sports drinks, juices) Eat 3 meals day,   1  snack a day at bedtime Space meals 4-6 hours apart Make eye doctor appointment Bring blood sugar records to the next appointment/class Get a Sharps container Carry fast acting glucose and a snack at all times Rotate injection sites Return for appointment/classes on:  04-28-15

## 2015-04-15 NOTE — Progress Notes (Signed)
Diabetes Self-Management Education  Visit Type: First/Initial  Appt. Start Time: 1100 Appt. End Time: 1200  04/15/2015  [name], identified by name and date of birth, is a 69 y.o. female with a diagnosis of Diabetes: Type 2.   ASSESSMENT  Blood pressure 137/63, height 5\' 2"  (1.575 m), weight 163 lb 8 oz (74.163 kg). Body mass index is 29.9 kg/(m^2).  Lacks knowledge of diabetes care and healthy diet Only checks BG's 1-2x/day with recent results 200's Overweight      Diabetes Self-Management Education - 04/15/15 1224    Visit Information   Visit Type First/Initial   Initial Visit   Diabetes Type Type 2   Health Coping   How would you rate your overall health? Fair   Psychosocial Assessment   Patient Belief/Attitude about Diabetes Motivated to manage diabetes   Self-care barriers None   Patient Concerns Glycemic Control;Weight Control;Healthy Lifestyle;Medication  prevent complications   Special Needs None   Preferred Learning Style Auditory;Visual;Hands on   Learning Readiness Ready   How often do you need to have someone help you when you read instructions, pamphlets, or other written materials from your doctor or pharmacy? 1 - Never   What is the last grade level you completed in school? [id number]   Complications   Last HgB A1C per patient/outside source --  >14.0 (03-19-15)   How often do you check your blood sugar? 1-2 times/day  before breakfast and/or before  supper   Fasting Blood glucose range (mg/dL) >200   Have you had a dilated eye exam in the past 12 months? Yes   Have you had a dental exam in the past 12 months? No   Are you checking your feet? Yes   How many days per week are you checking your feet? 4   Dietary Intake   Lunch --  eats lunch early at 10:30am   Dinner --  supper time varies 5-9pm; eats fried foods 4-5x/wk; sweets 2-3x/wk   Snack (evening) --  eats chips, crackers, nuts for afternoon snacks   Beverage(s) --  drinks regular sodas  and sweet tea 4-5x/day and drinks fruit juice 1x/day; drinks Crystal Lite 2-3x/day   Exercise   Exercise Type ADL's   Patient Education   Previous Diabetes Education No   Disease state  --  discussed definition of type 2 diabetes and treatment options   Nutrition management  Role of diet in the treatment of diabetes and the relationship between the three main macronutrients and blood glucose level;Food label reading, portion sizes and measuring food.  basic carbohydrate counting   Physical activity and exercise  Role of exercise on diabetes management, blood pressure control and cardiac health.   Medications Taught/reviewed insulin injection, site rotation, insulin storage and needle disposal.;Reviewed patients medication for diabetes, action, purpose, timing of dose and side effects.   Monitoring Purpose and frequency of SMBG.;Identified appropriate SMBG and/or A1C goals.;Yearly dilated eye exam;Taught/discussed recording of test results and interpretation of SMBG.   Acute complications Taught treatment of hypoglycemia - the 15 rule.  gave pt 1 tube of glucose tablets for use if needed for low BG   Chronic complications Relationship between chronic complications and blood glucose control;Dental care;Retinopathy and reason for yearly dilated eye exams   Personal strategies to promote health Lifestyle issues that need to be addressed for better diabetes care;Helped patient develop diabetes management plan for (enter comment)      Individualized Plan for Diabetes Self-Management Training:   Learning Objective:  Patient will have a greater understanding of diabetes self-management. Patient education plan is to attend individual and/or group sessions per assessed needs and concerns.     Patient Instructions  Check blood sugars 2 x day before breakfast and before supper every day and some 2 hr pp supper Exercise:  Try walking 10-15 min    3  days a week-only if BG's<250 Avoid sugar sweetened  drinks (soda, tea, coffee, sports drinks, juices) Eat 3 meals day,   1  snack a day at bedtime Space meals 4-6 hours apart Make eye doctor appointment Bring blood sugar records to the next appointment/class Get a Sharps container Carry fast acting glucose and a snack at all times Rotate injection sites Return for appointment/classes on:  04-28-15    Education material provided: General Meal Planning Guidelines, 1 tube glucose tablets, low BG handout  If problems or questions, patient to contact team via:  [phone]  Future DSME appointment:  04-28-15

## 2015-04-28 ENCOUNTER — Ambulatory Visit: Payer: Self-pay

## 2015-04-29 ENCOUNTER — Ambulatory Visit: Payer: Self-pay

## 2015-05-05 ENCOUNTER — Ambulatory Visit: Payer: Self-pay

## 2015-05-12 ENCOUNTER — Ambulatory Visit: Payer: Self-pay

## 2015-05-19 ENCOUNTER — Encounter: Payer: Medicare Other | Attending: Family Medicine | Admitting: Dietician

## 2015-05-19 VITALS — Ht 62.0 in | Wt 166.3 lb

## 2015-05-19 DIAGNOSIS — E119 Type 2 diabetes mellitus without complications: Secondary | ICD-10-CM | POA: Diagnosis not present

## 2015-05-19 NOTE — Progress Notes (Signed)

## 2015-05-26 ENCOUNTER — Ambulatory Visit: Payer: Self-pay

## 2015-05-27 ENCOUNTER — Telehealth: Payer: Self-pay | Admitting: Dietician

## 2015-05-27 NOTE — Telephone Encounter (Signed)
Called patient due to missing class yesterday 05/26/15. Left a voicemail message for her to return on 06/02/15 for class 3, and reschedule missed class then.

## 2015-06-02 ENCOUNTER — Ambulatory Visit: Payer: Self-pay

## 2015-06-04 ENCOUNTER — Telehealth: Payer: Self-pay | Admitting: Dietician

## 2015-06-04 NOTE — Telephone Encounter (Signed)
Called patient after she missed class 3 on 06/02/15. Left voicemail message requesting a call back to reschedule classes, and offered 06/16/15 as next available date for Class 2, which she had also missed.

## 2015-06-23 ENCOUNTER — Telehealth: Payer: Self-pay | Admitting: Dietician

## 2015-06-23 NOTE — Telephone Encounter (Signed)
Have not heard from pt-called pt but pt not in-spoke with family member and left message for pt-gave dates of next class series beginning 06-30-15 and asked pt to call to confirm attendance

## 2015-07-01 ENCOUNTER — Encounter: Payer: Self-pay | Admitting: Dietician

## 2015-07-01 NOTE — Progress Notes (Signed)
Have not heard from pt-discharge letter sent to MD  

## 2015-07-14 ENCOUNTER — Other Ambulatory Visit: Payer: Self-pay | Admitting: Urology

## 2015-07-14 DIAGNOSIS — E041 Nontoxic single thyroid nodule: Secondary | ICD-10-CM

## 2015-07-15 ENCOUNTER — Other Ambulatory Visit: Payer: Self-pay | Admitting: Family Medicine

## 2015-07-15 DIAGNOSIS — E041 Nontoxic single thyroid nodule: Secondary | ICD-10-CM

## 2015-07-21 ENCOUNTER — Ambulatory Visit
Admission: RE | Admit: 2015-07-21 | Discharge: 2015-07-21 | Disposition: A | Discharge status: Home / Self Care | Payer: Medicare Other | Source: Ambulatory Visit | Attending: Urology | Admitting: Urology

## 2015-07-21 DIAGNOSIS — E041 Nontoxic single thyroid nodule: Secondary | ICD-10-CM | POA: Insufficient documentation

## 2016-07-15 ENCOUNTER — Inpatient Hospital Stay
Admission: EM | Admit: 2016-07-15 | Discharge: 2016-07-17 | DRG: 638 | Disposition: A | Discharge status: Home / Self Care | Payer: Medicare Other | Attending: Internal Medicine | Admitting: Internal Medicine

## 2016-07-15 DIAGNOSIS — E039 Hypothyroidism, unspecified: Secondary | ICD-10-CM | POA: Diagnosis present

## 2016-07-15 DIAGNOSIS — Z888 Allergy status to other drugs, medicaments and biological substances status: Secondary | ICD-10-CM

## 2016-07-15 DIAGNOSIS — E1100 Type 2 diabetes mellitus with hyperosmolarity without nonketotic hyperglycemic-hyperosmolar coma (NKHHC): Principal | ICD-10-CM | POA: Diagnosis present

## 2016-07-15 DIAGNOSIS — E785 Hyperlipidemia, unspecified: Secondary | ICD-10-CM | POA: Diagnosis present

## 2016-07-15 DIAGNOSIS — Z9114 Patient's other noncompliance with medication regimen: Secondary | ICD-10-CM

## 2016-07-15 DIAGNOSIS — E876 Hypokalemia: Secondary | ICD-10-CM | POA: Diagnosis present

## 2016-07-15 DIAGNOSIS — I10 Essential (primary) hypertension: Secondary | ICD-10-CM | POA: Diagnosis present

## 2016-07-15 DIAGNOSIS — N179 Acute kidney failure, unspecified: Secondary | ICD-10-CM | POA: Diagnosis present

## 2016-07-15 DIAGNOSIS — Z9119 Patient's noncompliance with other medical treatment and regimen: Secondary | ICD-10-CM

## 2016-07-15 DIAGNOSIS — R739 Hyperglycemia, unspecified: Secondary | ICD-10-CM

## 2016-07-15 DIAGNOSIS — Z833 Family history of diabetes mellitus: Secondary | ICD-10-CM

## 2016-07-15 LAB — BASIC METABOLIC PANEL
ANION GAP: 15 (ref 5–15)
BUN: 21 mg/dL — ABNORMAL HIGH (ref 6–20)
CHLORIDE: 94 mmol/L — AB (ref 101–111)
CO2: 22 mmol/L (ref 22–32)
Calcium: 10.1 mg/dL (ref 8.9–10.3)
Creatinine, Ser: 1.28 mg/dL — ABNORMAL HIGH (ref 0.44–1.00)
GFR calc Af Amer: 49 mL/min — ABNORMAL LOW (ref >60)
GFR, EST NON AFRICAN AMERICAN: 42 mL/min — AB (ref >60)
Glucose, Bld: 943 mg/dL (ref 65–99)
POTASSIUM: 4 mmol/L (ref 3.5–5.1)
Sodium: 131 mmol/L — ABNORMAL LOW (ref 135–145)

## 2016-07-15 LAB — CBC
HCT: 45.6 % (ref 35.0–47.0)
HEMOGLOBIN: 14.8 g/dL (ref 12.0–16.0)
MCH: 31 pg (ref 26.0–34.0)
MCHC: 32.5 g/dL (ref 32.0–36.0)
MCV: 95.3 fL (ref 80.0–100.0)
PLATELETS: 150 10*3/uL (ref 150–440)
RBC: 4.78 MIL/uL (ref 3.80–5.20)
RDW: 13.9 % (ref 11.5–14.5)
WBC: 10.4 10*3/uL (ref 3.6–11.0)

## 2016-07-15 LAB — BLOOD GAS, VENOUS
Acid-base deficit: 0.2 mmol/L (ref 0.0–2.0)
BICARBONATE: 25.4 mmol/L (ref 20.0–28.0)
O2 Saturation: 57.2 %
PATIENT TEMPERATURE: 37
PH VEN: 7.37 (ref 7.250–7.430)
pCO2, Ven: 44 mmHg (ref 44.0–60.0)
pO2, Ven: 31 mmHg — CL (ref 32.0–45.0)

## 2016-07-15 LAB — GLUCOSE, CAPILLARY: Glucose-Capillary: >600 mg/dL (ref 65–99)

## 2016-07-15 MED ORDER — SODIUM CHLORIDE 0.9 % IV BOLUS (SEPSIS)
2000.0000 mL | Freq: Once | INTRAVENOUS | Status: AC
  Administered 2016-07-15: 2000 mL via INTRAVENOUS

## 2016-07-15 NOTE — ED Provider Notes (Addendum)
[facility] Emergency Department Provider Note   First MD Initiated Contact with Patient 07/15/16 2300     (approximate)  I have reviewed the triage vital signs and the nursing notes.   HISTORY  Chief Complaint Hyperglycemia    HPI [name] is a 71 y.o. female with below list of chronic medical conditions including diabetes presents to the emergency department hyperglycemia. Patient states that she's been out of her diabetic medications for the past month. Patient admits to polyuria polydipsia and polyphagia. Patient states that she was seen at urgent care and prescribed her medications however she was notified by urgent care to proceed to the emergency department for further evaluation for markedly elevated glucose. Patient denies any recent illness.   Past Medical History:  Diagnosis Date  . Diabetes mellitus without complication ([place])   . Hyperlipidemia   . Hypertension   . Thyroid disease     There are no active problems to display for this patient.   Past Surgical History:  Procedure Laterality Date  . ANKLE FRACTURE SURGERY Left   . HARDWARE REMOVAL Left 09/17/2014   Procedure: HARDWARE REMOVAL/ANKLE;  Surgeon: Hessie Knows, MD;  Location: [facility] ORS;  Service: Orthopedics;  Laterality: Left;    Prior to Admission medications   Medication Sig Start Date End Date Taking? Authorizing Provider  aspirin 81 MG EC tablet Take 81 mg by mouth daily. Swallow whole.   Yes Historical Provider, MD  atorvastatin (LIPITOR) 80 MG tablet Take 1 tablet by mouth at bedtime. 11/22/14 07/15/16 Yes Historical Provider, MD  glipiZIDE (GLUCOTROL XL) 10 MG 24 hr tablet Take 1 tablet by mouth 2 (two) times daily. 09/25/14 07/15/16 Yes Historical Provider, MD  hydrochlorothiazide (HYDRODIURIL) 25 MG tablet Take 25 mg by mouth every morning.   Yes Historical Provider, MD  insulin NPH-regular Human (NOVOLIN 70/30) (70-30) 100 UNIT/ML injection Inject into the skin. 65  units daily in AM before breakfast and 75 units in pm before supper 03/19/15  Yes Historical Provider, MD  metFORMIN (GLUCOPHAGE) 1000 MG tablet Take 1 tablet by mouth 2 (two) times daily. 03/19/15 07/15/16 Yes Historical Provider, MD  telmisartan (MICARDIS) 40 MG tablet Take 40 mg by mouth daily.   Yes Historical Provider, MD    Allergies Ace inhibitors and Metformin and related  No family history on file.  Social History Social History  Substance Use Topics  . Smoking status: Never Smoker  . Smokeless tobacco: Never Used  . Alcohol use 0.0 - 0.6 oz/week     Comment: socially on rare occasions    Review of Systems Constitutional: No fever/chills Eyes: No visual changes. ENT: No sore throat. Cardiovascular: Denies chest pain. Respiratory: Denies shortness of breath. Gastrointestinal: No abdominal pain.  No nausea, no vomiting.  No diarrhea.  No constipation. Genitourinary: Negative for dysuria. Musculoskeletal: Negative for back pain. Skin: Negative for rash. Neurological: Negative for headaches, focal weakness or numbness.  10-point ROS otherwise negative.  ____________________________________________   PHYSICAL EXAM:  VITAL SIGNS: ED Triage Vitals [07/15/16 2237]  Enc Vitals Group     BP (!) 171/73     Pulse Rate (!) 119     Resp 20     Temp 98.7 F (37.1 C)     Temp Source Oral     SpO2 97 %     Weight 162 lb (73.5 kg)     Height 5\' 1"  (1.549 m)     Head Circumference      Peak  Flow      Pain Score      Pain Loc      Pain Edu?      Excl. in [place]?     Constitutional: Alert and oriented. Well appearing and in no acute distress. Eyes: Conjunctivae are normal. PERRL. EOMI. Head: Atraumatic. Mouth/Throat: Mucous membranes are moist.  Oropharynx non-erythematous. Neck: No stridor.  Cardiovascular: Normal rate, regular rhythm. Good peripheral circulation. Grossly normal heart sounds. Respiratory: Normal respiratory effort.  No retractions. Lungs  CTAB. Gastrointestinal: Soft and nontender. No distention.  Musculoskeletal: No lower extremity tenderness nor edema. No gross deformities of extremities. Neurologic:  Normal speech and language. No gross focal neurologic deficits are appreciated.  Skin:  Skin is warm, dry and intact. No rash noted. Psychiatric: Mood and affect are normal. Speech and behavior are normal.  ____________________________________________   LABS (all labs ordered are listed, but only abnormal results are displayed)  Labs Reviewed  GLUCOSE, CAPILLARY - Abnormal; Notable for the following:       Result Value   Glucose-Capillary >600 (*)    All other components within normal limits  BASIC METABOLIC PANEL - Abnormal; Notable for the following:    Sodium 131 (*)    Chloride 94 (*)    Glucose, Bld 943 (*)    BUN 21 (*)    Creatinine, Ser 1.28 (*)    GFR calc non Af Amer 42 (*)    GFR calc Af Amer 49 (*)    All other components within normal limits  URINALYSIS, COMPLETE (UACMP) WITH MICROSCOPIC - Abnormal; Notable for the following:    Color, Urine STRAW (*)    APPearance CLEAR (*)    Glucose, UA >=500 (*)    Ketones, ur 5 (*)    All other components within normal limits  BLOOD GAS, VENOUS - Abnormal; Notable for the following:    pO2, Ven 31.0 (*)    All other components within normal limits  CBC  CBG MONITORING, ED      Procedures   ____________________________________________   INITIAL IMPRESSION / ASSESSMENT AND PLAN / ED COURSE  Pertinent labs & imaging results that were available during my care of the patient were reviewed by me and considered in my medical decision making (see chart for details).  Patient received 2 L IV normal saline. Insulin 8 units IV. Patient discussed with Dr.Hugelmeyer for hospital admission for further evaluation and management      ____________________________________________  FINAL CLINICAL IMPRESSION(S) / ED DIAGNOSES  Final diagnoses:  Hyperglycemia   Hyperosmolar non-ketotic state in patient with type 2 diabetes mellitus ([place])     MEDICATIONS GIVEN DURING THIS VISIT:  Medications  sodium chloride 0.9 % bolus 2,000 mL (2,000 mLs Intravenous New Bag/Given 07/15/16 2317)  insulin aspart (novoLOG) injection 8 Units (8 Units Intravenous Given 07/16/16 0057)     NEW OUTPATIENT MEDICATIONS STARTED DURING THIS VISIT:  New Prescriptions   No medications on file    Modified Medications   No medications on file    Discontinued Medications   No medications on file     Note:  This document was prepared using Dragon voice recognition software and may include unintentional dictation errors.    Gregor Hams, MD 07/16/16 0236    Gregor Hams, MD 07/16/16 [phone]

## 2016-07-15 NOTE — ED Triage Notes (Signed)
Pt has been out of diabetic meds for over a month, went to urgent care and was given rx. Was called back tonight and told to come to ED for high blood sugar per blood work.

## 2016-07-16 DIAGNOSIS — Z9114 Patient's other noncompliance with medication regimen: Secondary | ICD-10-CM | POA: Diagnosis not present

## 2016-07-16 DIAGNOSIS — Z833 Family history of diabetes mellitus: Secondary | ICD-10-CM | POA: Diagnosis not present

## 2016-07-16 DIAGNOSIS — N179 Acute kidney failure, unspecified: Secondary | ICD-10-CM | POA: Diagnosis present

## 2016-07-16 DIAGNOSIS — E876 Hypokalemia: Secondary | ICD-10-CM | POA: Diagnosis present

## 2016-07-16 DIAGNOSIS — E1100 Type 2 diabetes mellitus with hyperosmolarity without nonketotic hyperglycemic-hyperosmolar coma (NKHHC): Secondary | ICD-10-CM | POA: Diagnosis present

## 2016-07-16 DIAGNOSIS — R739 Hyperglycemia, unspecified: Secondary | ICD-10-CM | POA: Diagnosis present

## 2016-07-16 DIAGNOSIS — Z9119 Patient's noncompliance with other medical treatment and regimen: Secondary | ICD-10-CM | POA: Diagnosis not present

## 2016-07-16 DIAGNOSIS — E039 Hypothyroidism, unspecified: Secondary | ICD-10-CM | POA: Diagnosis present

## 2016-07-16 DIAGNOSIS — E785 Hyperlipidemia, unspecified: Secondary | ICD-10-CM | POA: Diagnosis present

## 2016-07-16 DIAGNOSIS — I10 Essential (primary) hypertension: Secondary | ICD-10-CM | POA: Diagnosis present

## 2016-07-16 DIAGNOSIS — Z888 Allergy status to other drugs, medicaments and biological substances status: Secondary | ICD-10-CM | POA: Diagnosis not present

## 2016-07-16 LAB — GLUCOSE, CAPILLARY
GLUCOSE-CAPILLARY: 123 mg/dL — AB (ref 65–99)
GLUCOSE-CAPILLARY: 133 mg/dL — AB (ref 65–99)
GLUCOSE-CAPILLARY: 144 mg/dL — AB (ref 65–99)
GLUCOSE-CAPILLARY: 151 mg/dL — AB (ref 65–99)
GLUCOSE-CAPILLARY: 205 mg/dL — AB (ref 65–99)
GLUCOSE-CAPILLARY: 272 mg/dL — AB (ref 65–99)
GLUCOSE-CAPILLARY: 321 mg/dL — AB (ref 65–99)
GLUCOSE-CAPILLARY: 414 mg/dL — AB (ref 65–99)
Glucose-Capillary: 584 mg/dL (ref 65–99)
Glucose-Capillary: 447 mg/dL — ABNORMAL HIGH (ref 65–99)
Glucose-Capillary: 409 mg/dL — ABNORMAL HIGH (ref 65–99)
Glucose-Capillary: 342 mg/dL — ABNORMAL HIGH (ref 65–99)
Glucose-Capillary: 288 mg/dL — ABNORMAL HIGH (ref 65–99)
Glucose-Capillary: 195 mg/dL — ABNORMAL HIGH (ref 65–99)
Glucose-Capillary: 300 mg/dL — ABNORMAL HIGH (ref 65–99)
Glucose-Capillary: 250 mg/dL — ABNORMAL HIGH (ref 65–99)
Glucose-Capillary: 154 mg/dL — ABNORMAL HIGH (ref 65–99)

## 2016-07-16 LAB — BASIC METABOLIC PANEL
ANION GAP: 10 (ref 5–15)
ANION GAP: 6 (ref 5–15)
ANION GAP: 8 (ref 5–15)
ANION GAP: 6 (ref 5–15)
BUN: 14 mg/dL (ref 6–20)
BUN: 12 mg/dL (ref 6–20)
BUN: 11 mg/dL (ref 6–20)
BUN: 13 mg/dL (ref 6–20)
CALCIUM: 8.9 mg/dL (ref 8.9–10.3)
CALCIUM: 8.6 mg/dL — AB (ref 8.9–10.3)
CALCIUM: 8.7 mg/dL — AB (ref 8.9–10.3)
CALCIUM: 8.5 mg/dL — AB (ref 8.9–10.3)
CHLORIDE: 101 mmol/L (ref 101–111)
CO2: 26 mmol/L (ref 22–32)
CO2: 27 mmol/L (ref 22–32)
CO2: 27 mmol/L (ref 22–32)
CO2: 24 mmol/L (ref 22–32)
Chloride: 107 mmol/L (ref 101–111)
Chloride: 107 mmol/L (ref 101–111)
Chloride: 107 mmol/L (ref 101–111)
Creatinine, Ser: 1.01 mg/dL — ABNORMAL HIGH (ref 0.44–1.00)
Creatinine, Ser: 0.92 mg/dL (ref 0.44–1.00)
Creatinine, Ser: 0.96 mg/dL (ref 0.44–1.00)
Creatinine, Ser: 0.87 mg/dL (ref 0.44–1.00)
GFR calc Af Amer: >60 mL/min (ref >60)
GFR calc non Af Amer: 56 mL/min — ABNORMAL LOW (ref >60)
GFR calc non Af Amer: >60 mL/min (ref >60)
GFR, EST NON AFRICAN AMERICAN: 59 mL/min — AB (ref >60)
Glucose, Bld: 453 mg/dL — ABNORMAL HIGH (ref 65–99)
Glucose, Bld: 236 mg/dL — ABNORMAL HIGH (ref 65–99)
Glucose, Bld: 219 mg/dL — ABNORMAL HIGH (ref 65–99)
Glucose, Bld: 201 mg/dL — ABNORMAL HIGH (ref 65–99)
Potassium: 3.6 mmol/L (ref 3.5–5.1)
Potassium: 3.2 mmol/L — ABNORMAL LOW (ref 3.5–5.1)
Potassium: 3.3 mmol/L — ABNORMAL LOW (ref 3.5–5.1)
Potassium: 3.1 mmol/L — ABNORMAL LOW (ref 3.5–5.1)
SODIUM: 137 mmol/L (ref 135–145)
SODIUM: 140 mmol/L (ref 135–145)
SODIUM: 142 mmol/L (ref 135–145)
SODIUM: 137 mmol/L (ref 135–145)

## 2016-07-16 LAB — URINALYSIS, COMPLETE (UACMP) WITH MICROSCOPIC
BACTERIA UA: NONE SEEN
BILIRUBIN URINE: NEGATIVE
Glucose, UA: >=500 mg/dL — AB
HGB URINE DIPSTICK: NEGATIVE
Ketones, ur: 5 mg/dL — AB
LEUKOCYTES UA: NEGATIVE
Nitrite: NEGATIVE
PROTEIN: NEGATIVE mg/dL
Specific Gravity, Urine: 1.027 (ref 1.005–1.030)
Squamous Epithelial / LPF: NONE SEEN
pH: 5 (ref 5.0–8.0)

## 2016-07-16 LAB — BETA-HYDROXYBUTYRIC ACID
Beta-Hydroxybutyric Acid: 0.5 mmol/L — ABNORMAL HIGH (ref 0.05–0.27)
Beta-Hydroxybutyric Acid: 0.11 mmol/L (ref 0.05–0.27)

## 2016-07-16 LAB — CBC
HCT: 37.9 % (ref 35.0–47.0)
HEMOGLOBIN: 13 g/dL (ref 12.0–16.0)
MCH: 31.3 pg (ref 26.0–34.0)
MCHC: 34.3 g/dL (ref 32.0–36.0)
MCV: 91.3 fL (ref 80.0–100.0)
PLATELETS: 139 10*3/uL — AB (ref 150–440)
RBC: 4.16 MIL/uL (ref 3.80–5.20)
RDW: 13.3 % (ref 11.5–14.5)
WBC: 9.9 10*3/uL (ref 3.6–11.0)

## 2016-07-16 LAB — MAGNESIUM: MAGNESIUM: 1.9 mg/dL (ref 1.7–2.4)

## 2016-07-16 LAB — MRSA PCR SCREENING: MRSA BY PCR: NEGATIVE

## 2016-07-16 LAB — PHOSPHORUS: Phosphorus: 2.1 mg/dL — ABNORMAL LOW (ref 2.5–4.6)

## 2016-07-16 MED ORDER — INSULIN REGULAR BOLUS VIA INFUSION: 0.0000 [IU] | Freq: Three times a day (TID) | INTRAVENOUS | Status: DC

## 2016-07-16 MED ORDER — HYDROCHLOROTHIAZIDE 25 MG PO TABS
25.0000 mg | ORAL_TABLET | Freq: Every morning | ORAL | Status: DC
  Administered 2016-07-16: 25 mg via ORAL
  Filled 2016-07-16: qty 1

## 2016-07-16 MED ORDER — ATORVASTATIN CALCIUM 20 MG PO TABS
80.0000 mg | ORAL_TABLET | Freq: Every day | ORAL | Status: DC
  Administered 2016-07-16: 80 mg via ORAL
  Filled 2016-07-16: qty 4

## 2016-07-16 MED ORDER — SODIUM CHLORIDE 0.9 % IV SOLN
30.0000 meq | Freq: Once | INTRAVENOUS | Status: AC
  Administered 2016-07-16: 30 meq via INTRAVENOUS
  Filled 2016-07-16: qty 15

## 2016-07-16 MED ORDER — DEXTROSE 50 % IV SOLN: 25.0000 mL | INTRAVENOUS | Status: DC | PRN

## 2016-07-16 MED ORDER — ENOXAPARIN SODIUM 40 MG/0.4ML ~~LOC~~ SOLN
40.0000 mg | SUBCUTANEOUS | Status: DC
  Administered 2016-07-16: 40 mg via SUBCUTANEOUS
  Filled 2016-07-16 (×2): qty 0.4

## 2016-07-16 MED ORDER — ASPIRIN EC 81 MG PO TBEC
81.0000 mg | DELAYED_RELEASE_TABLET | Freq: Every day | ORAL | Status: DC
  Administered 2016-07-16: 81 mg via ORAL
  Filled 2016-07-16: qty 1

## 2016-07-16 MED ORDER — SODIUM CHLORIDE 0.9 % IV SOLN: INTRAVENOUS | Status: AC

## 2016-07-16 MED ORDER — IRBESARTAN 150 MG PO TABS
150.0000 mg | ORAL_TABLET | Freq: Every day | ORAL | Status: DC
  Administered 2016-07-16: 150 mg via ORAL
  Filled 2016-07-16: qty 1

## 2016-07-16 MED ORDER — POTASSIUM PHOSPHATES 15 MMOLE/5ML IV SOLN
30.0000 mmol | Freq: Once | INTRAVENOUS | Status: AC
  Administered 2016-07-16: 30 mmol via INTRAVENOUS
  Filled 2016-07-16: qty 10

## 2016-07-16 MED ORDER — DEXTROSE-NACL 5-0.45 % IV SOLN
INTRAVENOUS | Status: DC
  Administered 2016-07-16: 13:00:00 via INTRAVENOUS

## 2016-07-16 MED ORDER — DEXTROSE-NACL 5-0.45 % IV SOLN: INTRAVENOUS | Status: DC

## 2016-07-16 MED ORDER — SODIUM CHLORIDE 0.9 % IV SOLN: INTRAVENOUS | Status: DC

## 2016-07-16 MED ORDER — INSULIN ASPART PROT & ASPART (70-30 MIX) 100 UNIT/ML ~~LOC~~ SUSP
65.0000 [IU] | Freq: Two times a day (BID) | SUBCUTANEOUS | Status: DC
  Administered 2016-07-16: 65 [IU] via SUBCUTANEOUS
  Filled 2016-07-16: qty 65

## 2016-07-16 MED ORDER — INSULIN ASPART 100 UNIT/ML ~~LOC~~ SOLN
8.0000 [IU] | Freq: Once | SUBCUTANEOUS | Status: AC
  Administered 2016-07-16: 8 [IU] via INTRAVENOUS
  Filled 2016-07-16: qty 8

## 2016-07-16 MED ORDER — POTASSIUM CHLORIDE CRYS ER 20 MEQ PO TBCR
40.0000 meq | EXTENDED_RELEASE_TABLET | Freq: Once | ORAL | Status: AC
  Administered 2016-07-16: 40 meq via ORAL
  Filled 2016-07-16: qty 2

## 2016-07-16 MED ORDER — INSULIN REGULAR BOLUS VIA INFUSION
0.0000 [IU] | Freq: Three times a day (TID) | INTRAVENOUS | Status: DC
  Administered 2016-07-16: 6.7 [IU] via INTRAVENOUS
  Administered 2016-07-16: 4.1 [IU] via INTRAVENOUS
  Filled 2016-07-16: qty 10

## 2016-07-16 MED ORDER — MAGNESIUM SULFATE 2 GM/50ML IV SOLN
2.0000 g | Freq: Once | INTRAVENOUS | Status: AC
  Administered 2016-07-16: 2 g via INTRAVENOUS
  Filled 2016-07-16: qty 50

## 2016-07-16 MED ORDER — SODIUM CHLORIDE 0.9 % IV SOLN
INTRAVENOUS | Status: DC
  Administered 2016-07-16: 75 mL/h via INTRAVENOUS

## 2016-07-16 MED ORDER — SODIUM CHLORIDE 0.9 % IV SOLN
INTRAVENOUS | Status: DC
  Administered 2016-07-16: 3.5 [IU]/h via INTRAVENOUS
  Filled 2016-07-16: qty 2.5

## 2016-07-16 MED ORDER — SODIUM CHLORIDE 0.45 % IV SOLN: INTRAVENOUS | Status: DC

## 2016-07-16 NOTE — Progress Notes (Addendum)
Inpatient Diabetes Program Recommendations  AACE/ADA: New Consensus Statement on Inpatient Glycemic Control (2015)  Target Ranges:  Prepandial:   less than 140 mg/dL      Peak postprandial:   less than 180 mg/dL (1-2 hours)      Critically ill patients:  140 - 180 mg/dL   Lab Results  Component Value Date   GLUCAP 409 (H) 07/16/2016   HGBA1C 7.6 (H) 10/11/2012    Review of Glycemic Control  Results for MARGO, LAMA (MRN [id number]) as of 07/16/2016 07:21  Ref. Range 07/15/2016 22:42 07/16/2016 01:51 07/16/2016 04:48 07/16/2016 05:24 07/16/2016 06:42  Glucose-Capillary Latest Ref Range: 65 - 99 mg/dL >600 (HH) 584 (HH) 447 (H) 414 (H) 409 (H)    Diabetes history: Type 2 - out of medications x 1 month per MD note.  A1C pending.  Outpatient Diabetes medications:  Glucotrol XL 10mg  bid, Novolin 70/30  65 units qam, 75 units qpm, Metformin 1000mg  bid Current orders for Inpatient glycemic control: DKA order set  Inpatient Diabetes Program Recommendations:  Patient should have been placed on the IV insulin/ Glucostabilizer order set not DKA, based on the diagnosis of HHNK.    Please order the IV insulin/ Glucostabilizer order set. This will allow the patient to eat and the staff to cover it.    When the patient has 4 CBG within or below goal, MD must be called for transition orders for transition off IV insulin drip using the Glycemic Control order set.  SQ basal insulin MUST BE given 1-2 hours prior to d/c of the insulin drip   Gentry Fitz, RN, [place], [place], CDE Diabetes Coordinator Inpatient Diabetes Program  [phone] (Team Pager) [phone] ([place]) 07/16/2016 7:30 AM

## 2016-07-16 NOTE — ED Notes (Signed)
Lab called to check and see if Beta-hydroxybutyric acid level can be checked with blood already in lab. Lab states they will let me know if this RN will need to send down more blood.

## 2016-07-16 NOTE — Progress Notes (Signed)
[place] for electrolyte management  Indication: hypokalemia   Pharmacy consulted for electrolyte management for 71 yo female admitted with DKA. Patient received potassium 87mEq IV x 1 and potassium 72mEq PO x 1.   Plan:   Will order magnesium 2g IV x 1 and potassium phosphate 48mmol IV x 1.   Will recheck electrolytes with am labs.    Allergies  Allergen Reactions  . Ace Inhibitors Cough and Other (See Comments)  . Metformin And Related Nausea Only    Pt takes this medication    Patient Measurements: Height: 5\' 7"  (170.2 cm) Weight: 165 lb 9.1 oz (75.1 kg) IBW/kg (Calculated) : 61.6   Vital Signs: Temp: 98.8 F (37.1 C) (03/30 1950) Temp Source: Oral (03/30 1950) BP: 125/113 (03/30 1800) Pulse Rate: 93 (03/30 1800) Intake/Output from previous day: 03/29 0701 - 03/30 0700 In: 2040 [I.V.:40; IV Piggyback:2000] Out: -  Intake/Output from this shift: No intake/output data recorded.  Labs:  Recent Labs  07/15/16 2300  07/16/16 1024 07/16/16 1349 07/16/16 1716  WBC 10.4  --  9.9  --   --   HGB 14.8  --  13.0  --   --   HCT 45.6  --  37.9  --   --   PLT 150  --  139*  --   --   CREATININE 1.28*  < > 0.92 0.96 0.87  MG  --   --  1.9  --   --   PHOS  --   --  2.1*  --   --   < > = values in this interval not displayed. Estimated Creatinine Clearance: 65.5 mL/min (by C-G formula based on SCr of 0.87 mg/dL).   Microbiology: Recent Results (from the past 720 hour(s))  MRSA PCR Screening     Status: None   Collection Time: 07/16/16  2:29 PM  Result Value Ref Range Status   MRSA by PCR NEGATIVE NEGATIVE Final    Medical History: Past Medical History:  Diagnosis Date  . Diabetes mellitus without complication ([place])   . Hyperlipidemia   . Hypertension   . Thyroid disease    Pharmacy will continue to monitor and adjust per consult.    Simpson,Michael L 07/16/2016,8:58 PM

## 2016-07-16 NOTE — Progress Notes (Signed)
Spoke with patient , her daughter and her son regarding recent omission of her medications.  She tells RN that she couldn't afford them and never told her children; she lost her job about a month ago.  Both the son and the daughter are frustrated that she did not tell them and assure [place] and the patient that they will make sure she gets her medications.  The daughter works at [employer] and has transferred all her mother's prescriptions there.  I have also given them the names of community resources if they are needed in the future; [facility], [place] [facility] ($25.00/bottle) , [facility], [facility].  [facility] also has a medication program.  Patient and family receptive to information.   At transition, recommend Levemir 25 units, Novolog 10 units tid with meals, Novolog 0-9 units (sensitive scale) tid , Novolog 0-5 units qhs  If she is discharged home tonight, she should resume her home medications tomorrow am starting with Novolin 70/30 65 units before breakfast.  No Levemir or Novolog insulin should be ordered for home use.   Home medication orders; Glucotrol XL 10mg  bid, Novolin 70/30  65 units qam, 75 units qpm, Metformin 1000mg  bid  Gentry Fitz, RN, [place], [place], CDE Diabetes Coordinator Inpatient Diabetes Program  [phone] (Team Pager) [phone] ([place]) 07/16/2016 1:09 PM

## 2016-07-16 NOTE — Progress Notes (Signed)
[place] at [place] NAME: [name]    MR#:  [id number]  DATE OF BIRTH:  1946-02-19  SUBJECTIVE:   Doing well this morning. She is hungry  REVIEW OF SYSTEMS:    Review of Systems  Constitutional: Negative.  Negative for chills, fever and malaise/fatigue.  HENT: Negative.  Negative for ear discharge, ear pain, hearing loss, nosebleeds and sore throat.   Eyes: Negative.  Negative for blurred vision and pain.  Respiratory: Negative.  Negative for cough, hemoptysis, shortness of breath and wheezing.   Cardiovascular: Negative.  Negative for chest pain, palpitations and leg swelling.  Gastrointestinal: Negative.  Negative for abdominal pain, blood in stool, diarrhea, nausea and vomiting.  Genitourinary: Negative.  Negative for dysuria.  Musculoskeletal: Negative.  Negative for back pain.  Skin: Negative.   Neurological: Negative for dizziness, tremors, speech change, focal weakness, seizures and headaches.  Endo/Heme/Allergies: Negative.  Does not bruise/bleed easily.  Psychiatric/Behavioral: Negative.  Negative for depression, hallucinations and suicidal ideas.    Tolerating Diet: npo      DRUG ALLERGIES:   Allergies  Allergen Reactions  . Ace Inhibitors Cough and Other (See Comments)  . Metformin And Related Nausea Only    Pt takes this medication    VITALS:  Blood pressure (!) 103/54, pulse 80, temperature 98.3 F (36.8 C), temperature source Oral, resp. rate 17, height 5\' 7"  (1.702 m), weight 75.1 kg (165 lb 9.1 oz), SpO2 98 %.  PHYSICAL EXAMINATION:   Physical Exam  Constitutional: She is oriented to person, place, and time and well-developed, well-nourished, and in no distress. No distress.  HENT:  Head: Normocephalic.  Eyes: No scleral icterus.  Neck: Normal range of motion. Neck supple. No JVD present. No tracheal deviation present.  Cardiovascular: Normal rate, regular rhythm and normal heart sounds.  Exam  reveals no gallop and no friction rub.   No murmur heard. Pulmonary/Chest: Effort normal and breath sounds normal. No respiratory distress. She has no wheezes. She has no rales. She exhibits no tenderness.  Abdominal: Soft. Bowel sounds are normal. She exhibits no distension and no mass. There is no tenderness. There is no rebound and no guarding.  Musculoskeletal: Normal range of motion. She exhibits no edema.  Neurological: She is alert and oriented to person, place, and time.  Skin: Skin is warm. No rash noted. No erythema.  Psychiatric: Affect and judgment normal.      LABORATORY PANEL:   CBC  Recent Labs Lab 07/16/16 1024  WBC 9.9  HGB 13.0  HCT 37.9  PLT 139*   ------------------------------------------------------------------------------------------------------------------  Chemistries   Recent Labs Lab 07/16/16 1024  NA 140  K 3.2*  CL 107  CO2 27  GLUCOSE 236*  BUN 12  CREATININE 0.92  CALCIUM 8.6*  MG 1.9   ------------------------------------------------------------------------------------------------------------------  Cardiac Enzymes No results for input(s): TROPONINI in the last 168 hours. ------------------------------------------------------------------------------------------------------------------  RADIOLOGY:  No results found.   ASSESSMENT AND PLAN:   71 year old female with history of diabetes and essential hypertension who is admitted with hyperglycemia.  1. HHNK: Continue glucose stabilizer as per recommendations by diabetes coordinator. Okay to place ADA diet Transitioned to subcutaneous insulin 1 for CBGs within or below goal.  2. Hyperlipidemia: Continue statin  3. Essential hypertension: Blood pressure is controlled on HCTZ and Avapro  4. Hypokalemia/hypophosphatemia: Replete and recheck in a.m.      Management plans discussed with the patient and she is in agreement.  CODE STATUS: full  TOTAL TIME TAKING CARE OF THIS  PATIENT: 28 minutes.     POSSIBLE D/C tomorrow, DEPENDING ON CLINICAL CONDITION.   [name] M.D on 07/16/2016 at 12:27 PM  Between 7am to 6pm - Pager - [phone] After 6pm go to [email or web address] - password [email or web address] [facility]  Office  [phone]  CC: Primary care physician; Sharyne Peach, MD  Note: This dictation was prepared with Dragon dictation along with smaller phrase technology. Any transcriptional errors that result from this process are unintentional.

## 2016-07-16 NOTE — Care Management (Addendum)
Patient is admitted with hyperglycemia requiring insulin drip. She has not been taking her diabetic medications or following her blood sugar.  Daughter states that the reason patient did not take her medications was because she could not get them refilled because she missed her doctor's appointment.  Patient knows to check her blood sugars. At present patient nor her daughters know if patient has medicare D coverage.  She has just been approved for medicaid and has the paper work but not the card.  Patient Independent in all adls, denies issues accessing medical care, obtaining medications or with transportation.  Patient and her daughter do not feel that patient needs any home health nurse follow up for glycemic assessment. Discussed  [facility] as a resource. Daughter stated there would be no problems obtaining patient's medications.  Patient has recently applied for medicare B.

## 2016-07-16 NOTE — Progress Notes (Signed)
Pt is ready to be transitioned off the insulin gtt. Dr. Benjie Karvonen informed that pt does not take Lantus at home. Verbal orders given for 70/30 insulin per pt's home dose. Pt stated she takes 65 units in the morning and 75 units at night. 65 units ordered now. Will continue to monitor.

## 2016-07-16 NOTE — H&P (Signed)
History and Physical   [facility] - [place] @ [facility] Admission History and Physical [employer], D.O.    Patient Name: [name]  MR#: [id number] Date of Birth: 01-Mar-1946 Date of Admission: 07/15/2016  Referring MD/NP/PA: Dr. Owens Bullock Primary Care Physician: Teresa Peach, MD Patient coming from: Home  Chief Complaint:  Chief Complaint  Patient presents with  . Hyperglycemia    HPI: [name] is a 71 y.o. female with a known history of Diabetes, hypertension, hyperlipidemia, hypothyroidism presents to the emergency department for evaluation of high blood sugar.  Patient was in a usual state of health until this afternoon when she was seen by an urgent care for symptoms of generalized weakness. She was found to have a blood sugar in the 800s and was sent to the emergency department for evaluation. Patient states that feeling slightly weak she feels generally well. She has been out of her medications for the past month or so. She is not compliant with checking her blood sugars  Patient denies fevers/chills, dizziness, chest pain, shortness of breath, N/V/C/D, abdominal pain, dysuria/frequency, changes in mental status.    Otherwise there has been no change in status. No recent antibiotics.  There has been no recent illness, hospitalizations, travel or sick contacts.    EMS/ED Course: Patient received normal saline and 8 units IV insulin.  Review of Systems:  CONSTITUTIONAL: Positive generalized weakness. No fever/chills, fatigue, weakness, weight gain/loss, headache. EYES: No blurry or double vision. ENT: No tinnitus, postnasal drip, redness or soreness of the oropharynx. RESPIRATORY: No cough, dyspnea, wheeze.  No hemoptysis.  CARDIOVASCULAR: No chest pain, palpitations, syncope, orthopnea. No lower extremity edema.  GASTROINTESTINAL: No nausea, vomiting, abdominal pain, diarrhea, constipation.  No hematemesis, melena or hematochezia. GENITOURINARY: No  dysuria, frequency, hematuria. ENDOCRINE: No nocturia. No heat or cold intolerance. Positive polydipsia and polyuria. Increased thirst HEMATOLOGY: No anemia, bruising, bleeding. INTEGUMENTARY: No rashes, ulcers, lesions. MUSCULOSKELETAL: No arthritis, gout, dyspnea. NEUROLOGIC: No numbness, tingling, ataxia, seizure-type activity, weakness. PSYCHIATRIC: No anxiety, depression, insomnia.   Past Medical History:  Diagnosis Date  . Diabetes mellitus without complication ([place])   . Hyperlipidemia   . Hypertension   . Thyroid disease   Hypergammaglobulinemia  Past Surgical History:  Procedure Laterality Date  . ANKLE FRACTURE SURGERY Left   . HARDWARE REMOVAL Left 09/17/2014   Procedure: HARDWARE REMOVAL/ANKLE;  Surgeon: Hessie Knows, MD;  Location: [facility] ORS;  Service: Orthopedics;  Laterality: Left;     reports that she has never smoked. She has never used smokeless tobacco. She reports that she drinks alcohol. She reports that she does not use drugs.  Allergies  Allergen Reactions  . Ace Inhibitors Cough and Other (See Comments)  . Metformin And Related Nausea Only    Pt takes this medication   Family History   Medical History Relation Name Comments  Diabetes type II Maternal Grandmother    Diabetes type II Mother       Prior to Admission medications   Medication Sig Start Date End Date Taking? Authorizing Provider  aspirin 81 MG EC tablet Take 81 mg by mouth daily. Swallow whole.   Yes Historical Provider, MD  atorvastatin (LIPITOR) 80 MG tablet Take 1 tablet by mouth at bedtime. 11/22/14 07/15/16 Yes Historical Provider, MD  glipiZIDE (GLUCOTROL XL) 10 MG 24 hr tablet Take 1 tablet by mouth 2 (two) times daily. 09/25/14 07/15/16 Yes Historical Provider, MD  hydrochlorothiazide (HYDRODIURIL) 25 MG tablet Take 25 mg by mouth every morning.  Yes Historical Provider, MD  insulin NPH-regular Human (NOVOLIN 70/30) (70-30) 100 UNIT/ML injection Inject into the skin. 65 units daily  in AM before breakfast and 75 units in pm before supper 03/19/15  Yes Historical Provider, MD  metFORMIN (GLUCOPHAGE) 1000 MG tablet Take 1 tablet by mouth 2 (two) times daily. 03/19/15 07/15/16 Yes Historical Provider, MD  telmisartan (MICARDIS) 40 MG tablet Take 40 mg by mouth daily.   Yes Historical Provider, MD    Physical Exam: Vitals:   07/15/16 2315 07/15/16 2330 07/16/16 0030 07/16/16 0045  BP: (!) 155/75 (!) 159/78 (!) 148/51 (!) 144/79  Pulse: 96 84 68 66  Resp:   (!) 21 19  Temp:      TempSrc:      SpO2: 97% 99% 100% 100%  Weight:      Height:        GENERAL: 71 y.o.-year-old Female patient, well-developed, well-nourished lying in the bed in no acute distress.  Pleasant and cooperative.   HEENT: Head atraumatic, normocephalic. Pupils equal, round, reactive to light and accommodation. No scleral icterus. Extraocular muscles intact. Nares are patent. Oropharynx is clear. Mucus membranes moist. NECK: Supple, full range of motion. No JVD, no bruit heard. No thyroid enlargement, no tenderness, no cervical lymphadenopathy. CHEST: Normal breath sounds bilaterally. No wheezing, rales, rhonchi or crackles. No use of accessory muscles of respiration.  No reproducible chest wall tenderness.  CARDIOVASCULAR: S1, S2 normal. No murmurs, rubs, or gallops. Cap refill <2 seconds. Pulses intact distally.  ABDOMEN: Soft, nondistended, nontender. No rebound, guarding, rigidity. Normoactive bowel sounds present in all four quadrants. No organomegaly or mass. EXTREMITIES: No pedal edema, cyanosis, or clubbing. No calf tenderness or Homan's sign.  NEUROLOGIC: The patient is alert and oriented x 3. Cranial nerves II through XII are grossly intact with no focal sensorimotor deficit. Muscle strength 5/5 in all extremities. Sensation intact. Gait not checked. PSYCHIATRIC:  Normal affect, mood, thought content. SKIN: Warm, dry, and intact without obvious rash, lesion, or ulcer.    Labs on  Admission:  CBC:  Recent Labs Lab 07/15/16 2300  WBC 10.4  HGB 14.8  HCT 45.6  MCV 95.3  PLT 203   Basic Metabolic Panel:  Recent Labs Lab 07/15/16 2300  NA 131*  K 4.0  CL 94*  CO2 22  GLUCOSE 943*  BUN 21*  CREATININE 1.28*  CALCIUM 10.1   GFR: Estimated Creatinine Clearance: 38.6 mL/min (A) (by C-G formula based on SCr of 1.28 mg/dL (H)). Liver Function Tests: No results for input(s): AST, ALT, ALKPHOS, BILITOT, PROT, ALBUMIN in the last 168 hours. No results for input(s): LIPASE, AMYLASE in the last 168 hours. No results for input(s): AMMONIA in the last 168 hours. Coagulation Profile: No results for input(s): INR, PROTIME in the last 168 hours. Cardiac Enzymes: No results for input(s): CKTOTAL, CKMB, CKMBINDEX, TROPONINI in the last 168 hours. BNP (last 3 results) No results for input(s): PROBNP in the last 8760 hours. HbA1C: No results for input(s): HGBA1C in the last 72 hours. CBG:  Recent Labs Lab 07/15/16 2242  GLUCAP >600*   Lipid Profile: No results for input(s): CHOL, HDL, LDLCALC, TRIG, CHOLHDL, LDLDIRECT in the last 72 hours. Thyroid Function Tests: No results for input(s): TSH, T4TOTAL, FREET4, T3FREE, THYROIDAB in the last 72 hours. Anemia Panel: No results for input(s): VITAMINB12, FOLATE, FERRITIN, TIBC, IRON, RETICCTPCT in the last 72 hours. Urine analysis:    Component Value Date/Time   COLORURINE STRAW (A) 07/16/2016 0007  APPEARANCEUR CLEAR (A) 07/16/2016 0007   LABSPEC 1.027 07/16/2016 0007   PHURINE 5.0 07/16/2016 0007   GLUCOSEU >=500 (A) 07/16/2016 0007   HGBUR NEGATIVE 07/16/2016 0007   BILIRUBINUR NEGATIVE 07/16/2016 0007   KETONESUR 5 (A) 07/16/2016 0007   PROTEINUR NEGATIVE 07/16/2016 0007   NITRITE NEGATIVE 07/16/2016 0007   LEUKOCYTESUR NEGATIVE 07/16/2016 0007   Sepsis Labs: @LABRCNTIP (procalcitonin:4,lacticidven:4) )No results found for this or any previous visit (from the past 240 hour(s)).   Radiological  Exams on Admission: CXR Pending  EKG: Pending Assessment/Plan  This is a 71 y.o. female with a history of diabetes, hypertension, hyperlipidemia, hypothyroidism now being admitted with:  #. HHNK -Admit to stepdown -IV fluid hydration -Accu-Cheks every hour -Check hemoglobin A1c -Check serum ketones -Hold glipizide and metformin  #. AKI liquids secondary to #1 -IV fluids -Repeat BMP in a.m.  #. History of hypertension -Continue HCTZ, Avapro  #. History of hyperlipidemia -Continue Lipitor  Admission status: Inpatient, stepdown IV Fluids: Normal saline Diet/Nutrition: Nothing by mouth Consults called: None  DVT Px: Lovenox, SCDs and early ambulation. Code Status: Full Code  Disposition Plan: To home in 1-2 days  All the records are reviewed and case discussed with ED provider. Management plans discussed with the patient and/or family who express understanding and agree with plan of care.  Lasheika Ortloff D.O. on 07/16/2016 at 1:32 AM Between 7am to 6pm - Pager - [phone] After 6pm go to [email or web address] - Proofreader [facility] [place] [facility] Office [phone] CC: Primary care physician; Teresa Peach, MD   07/16/2016, 1:32 AM

## 2016-07-16 NOTE — ED Notes (Signed)
Pt reports not taking "metformin, glipizide, hctz or micardis" for the past month. Pt states she "ran out of prescription" for those medications.

## 2016-07-17 LAB — BASIC METABOLIC PANEL
ANION GAP: 7 (ref 5–15)
BUN: 10 mg/dL (ref 6–20)
CHLORIDE: 105 mmol/L (ref 101–111)
CO2: 23 mmol/L (ref 22–32)
Calcium: 8.4 mg/dL — ABNORMAL LOW (ref 8.9–10.3)
Creatinine, Ser: 0.81 mg/dL (ref 0.44–1.00)
Glucose, Bld: 215 mg/dL — ABNORMAL HIGH (ref 65–99)
POTASSIUM: 3.7 mmol/L (ref 3.5–5.1)
SODIUM: 135 mmol/L (ref 135–145)

## 2016-07-17 LAB — PHOSPHORUS: PHOSPHORUS: 5.3 mg/dL — AB (ref 2.5–4.6)

## 2016-07-17 LAB — GLUCOSE, CAPILLARY
GLUCOSE-CAPILLARY: 247 mg/dL — AB (ref 65–99)
Glucose-Capillary: 196 mg/dL — ABNORMAL HIGH (ref 65–99)

## 2016-07-17 LAB — HEMOGLOBIN A1C
HEMOGLOBIN A1C: 14.6 % — AB (ref 4.8–5.6)
Mean Plasma Glucose: 372 mg/dL

## 2016-07-17 LAB — MAGNESIUM: MAGNESIUM: 2.3 mg/dL (ref 1.7–2.4)

## 2016-07-17 MED ORDER — INSULIN ASPART PROT & ASPART (70-30 MIX) 100 UNIT/ML ~~LOC~~ SUSP: 65.0000 [IU] | Freq: Two times a day (BID) | SUBCUTANEOUS | 0 refills | Status: AC

## 2016-07-17 MED ORDER — ATORVASTATIN CALCIUM 80 MG PO TABS: 80.0000 mg | ORAL_TABLET | Freq: Every day | ORAL | 0 refills | Status: DC

## 2016-07-17 MED ORDER — GLIPIZIDE ER 10 MG PO TB24: 10.0000 mg | ORAL_TABLET | Freq: Two times a day (BID) | ORAL | 0 refills | Status: AC

## 2016-07-17 NOTE — Progress Notes (Signed)
Per Dr. Benjie Karvonen, prescription for Micardis 40 mg tablet QD, #30, no refills called in to [place], [place].

## 2016-07-17 NOTE — Discharge Summary (Signed)
[place] at [place] NAME: [name]    MR#:  [id number]  DATE OF BIRTH:  1946-01-30  DATE OF ADMISSION:  07/15/2016 ADMITTING PHYSICIAN: Harvie Bridge, DO  DATE OF DISCHARGE: 07/17/2016  PRIMARY CARE PHYSICIAN: Sharyne Peach, MD    ADMISSION DIAGNOSIS:  Hyperglycemia [R73.9] Hyperosmolar non-ketotic state in patient with type 2 diabetes mellitus ([place]) [E11.01]  DISCHARGE DIAGNOSIS:  Active Problems:   Diabetic hyperosmolar non-ketotic state ([place])   SECONDARY DIAGNOSIS:   Past Medical History:  Diagnosis Date  . Diabetes mellitus without complication ([place])   . Hyperlipidemia   . Hypertension   . Thyroid disease     HOSPITAL COURSE:  71 year old female with history of diabetes and essential hypertension who is admitted with hyperglycemia.  1. HHNK: She was placed on insulin gtt using HHNK protocol and weaned to [place] home dose insulin. HSe had not been on medications for over a month which is the reason she came in with severe hyperglycemia. She will continue insulin and oral Glipizide with close follow up with her PCP and monitoring of her blood sugars. 2. Hyperlipidemia: Continue statin  3. Essential hypertension: Blood pressure was on the low normal side therefore HCTZ is discontinued for now. SHe will continue ARB.  4. Hypokalemia/hypophosphatemia: Repleted and resolved   DISCHARGE CONDITIONS AND DIET:   Stable Cardiac diet  CONSULTS OBTAINED:    DRUG ALLERGIES:   Allergies  Allergen Reactions  . Ace Inhibitors Cough and Other (See Comments)  . Metformin And Related Nausea Only    Pt takes this medication    DISCHARGE MEDICATIONS:   Current Discharge Medication List    START taking these medications   Details  insulin aspart protamine- aspart (NOVOLOG MIX 70/30) (70-30) 100 UNIT/ML injection Inject 0.65 mLs (65 Units total) into the skin 2 times daily at 12 noon and 4 pm. Qty: 39 mL, Refills:  0      CONTINUE these medications which have CHANGED   Details  atorvastatin (LIPITOR) 80 MG tablet Take 1 tablet (80 mg total) by mouth at bedtime. Qty: 30 tablet, Refills: 0    glipiZIDE (GLUCOTROL XL) 10 MG 24 hr tablet Take 1 tablet (10 mg total) by mouth 2 (two) times daily. Qty: 60 tablet, Refills: 0      CONTINUE these medications which have NOT CHANGED   Details  aspirin 81 MG EC tablet Take 81 mg by mouth daily. Swallow whole.    telmisartan (MICARDIS) 40 MG tablet Take 40 mg by mouth daily.      STOP taking these medications     hydrochlorothiazide (HYDRODIURIL) 25 MG tablet      insulin NPH-regular Human (NOVOLIN 70/30) (70-30) 100 UNIT/ML injection      metFORMIN (GLUCOPHAGE) 1000 MG tablet           Today   CHIEF COMPLAINT:  Doing well this am no issues   VITAL SIGNS:  Blood pressure (!) 112/62, pulse 93, temperature 98.8 F (37.1 C), temperature source Oral, resp. rate 17, height 5\' 7"  (1.702 m), weight 75.1 kg (165 lb 9.1 oz), SpO2 99 %.   REVIEW OF SYSTEMS:  Review of Systems  Constitutional: Negative.  Negative for chills, fever and malaise/fatigue.  HENT: Negative.  Negative for ear discharge, ear pain, hearing loss, nosebleeds and sore throat.   Eyes: Negative.  Negative for blurred vision and pain.  Respiratory: Negative.  Negative for cough, hemoptysis, shortness of breath and wheezing.  Cardiovascular: Negative.  Negative for chest pain, palpitations and leg swelling.  Gastrointestinal: Negative.  Negative for abdominal pain, blood in stool, diarrhea, nausea and vomiting.  Genitourinary: Negative.  Negative for dysuria.  Musculoskeletal: Negative.  Negative for back pain.  Skin: Negative.   Neurological: Negative for dizziness, tremors, speech change, focal weakness, seizures and headaches.  Endo/Heme/Allergies: Negative.  Does not bruise/bleed easily.  Psychiatric/Behavioral: Negative.  Negative for depression, hallucinations and  suicidal ideas.     PHYSICAL EXAMINATION:  GENERAL:  71 y.o.-year-old patient lying in the bed with no acute distress.  NECK:  Supple, no jugular venous distention. No thyroid enlargement, no tenderness.  LUNGS: Normal breath sounds bilaterally, no wheezing, rales,rhonchi  No use of accessory muscles of respiration.  CARDIOVASCULAR: S1, S2 normal. No murmurs, rubs, or gallops.  ABDOMEN: Soft, non-tender, non-distended. Bowel sounds present. No organomegaly or mass.  EXTREMITIES: No pedal edema, cyanosis, or clubbing.  PSYCHIATRIC: The patient is alert and oriented x 3.  SKIN: No obvious rash, lesion, or ulcer.   DATA REVIEW:   CBC  Recent Labs Lab 07/16/16 1024  WBC 9.9  HGB 13.0  HCT 37.9  PLT 139*    Chemistries   Recent Labs Lab 07/17/16 0408  NA 135  K 3.7  CL 105  CO2 23  GLUCOSE 215*  BUN 10  CREATININE 0.81  CALCIUM 8.4*  MG 2.3    Cardiac Enzymes No results for input(s): TROPONINI in the last 168 hours.  Microbiology Results  @[email or web address] @  RADIOLOGY:  No results found.    Current Discharge Medication List    START taking these medications   Details  insulin aspart protamine- aspart (NOVOLOG MIX 70/30) (70-30) 100 UNIT/ML injection Inject 0.65 mLs (65 Units total) into the skin 2 times daily at 12 noon and 4 pm. Qty: 39 mL, Refills: 0      CONTINUE these medications which have CHANGED   Details  atorvastatin (LIPITOR) 80 MG tablet Take 1 tablet (80 mg total) by mouth at bedtime. Qty: 30 tablet, Refills: 0    glipiZIDE (GLUCOTROL XL) 10 MG 24 hr tablet Take 1 tablet (10 mg total) by mouth 2 (two) times daily. Qty: 60 tablet, Refills: 0      CONTINUE these medications which have NOT CHANGED   Details  aspirin 81 MG EC tablet Take 81 mg by mouth daily. Swallow whole.    telmisartan (MICARDIS) 40 MG tablet Take 40 mg by mouth daily.      STOP taking these medications     hydrochlorothiazide (HYDRODIURIL) 25 MG tablet      insulin  NPH-regular Human (NOVOLIN 70/30) (70-30) 100 UNIT/ML injection      metFORMIN (GLUCOPHAGE) 1000 MG tablet            Management plans discussed with the patient and she is in agreement. Stable for discharge home  Patient should follow up with pcp  CODE STATUS:     Code Status Orders        Start     Ordered   07/16/16 0525  Full code  Continuous     07/16/16 0524    Code Status History    Date Active Date Inactive Code Status Order ID Comments User Context   09/17/2014 12:45 PM 09/17/2014  6:14 PM Full Code [id number]  Hessie Knows, MD Inpatient      TOTAL TIME TAKING CARE OF THIS PATIENT: 38 minutes.    Note: This dictation was prepared with Dragon dictation along  with smaller phrase technology. Any transcriptional errors that result from this process are unintentional.  Montrel Donahoe M.D on 07/17/2016 at 7:08 AM  Between 7am to 6pm - Pager - [phone] After 6pm go to [email or web address] - password [email or web address]  Office  [phone]  CC: Primary care physician; Sharyne Peach, MD

## 2016-07-17 NOTE — Progress Notes (Signed)
[place] for electrolyte management  Indication: hypokalemia   Assessment: Pharmacy consulted for electrolyte management for 71 yo female admitted with DKA.   Plan:   Electrolytes WNL.   Will recheck electrolytes with am labs.    Allergies  Allergen Reactions  . Ace Inhibitors Cough and Other (See Comments)  . Metformin And Related Nausea Only    Pt takes this medication    Patient Measurements: Height: 5\' 7"  (170.2 cm) Weight: 165 lb 9.1 oz (75.1 kg) IBW/kg (Calculated) : 61.6   Vital Signs: Temp: 98.8 F (37.1 C) (03/30 1950) Temp Source: Oral (03/30 1950) Intake/Output from previous day: 03/30 0701 - 03/31 0700 In: 1217.5 [P.O.:480; I.V.:432.5; IV Piggyback:305] Out: 250 [Urine:250] Intake/Output from this shift: No intake/output data recorded.  Labs:  Recent Labs  07/15/16 2300  07/16/16 1024 07/16/16 1349 07/16/16 1716 07/17/16 0408  WBC 10.4  --  9.9  --   --   --   HGB 14.8  --  13.0  --   --   --   HCT 45.6  --  37.9  --   --   --   PLT 150  --  139*  --   --   --   CREATININE 1.28*  < > 0.92 0.96 0.87 0.81  MG  --   --  1.9  --   --  2.3  PHOS  --   --  2.1*  --   --  5.3*  < > = values in this interval not displayed. Estimated Creatinine Clearance: 70.3 mL/min (by C-G formula based on SCr of 0.81 mg/dL).   Potassium  Date Value Ref Range Status  07/17/2016 3.7 3.5 - 5.1 mmol/L Final  05/15/2014 3.5 3.5 - 5.1 mmol/L Final      Microbiology: Recent Results (from the past 720 hour(s))  MRSA PCR Screening     Status: None   Collection Time: 07/16/16  2:29 PM  Result Value Ref Range Status   MRSA by PCR NEGATIVE NEGATIVE Final    Medical History: Past Medical History:  Diagnosis Date  . Diabetes mellitus without complication ([place])   . Hyperlipidemia   . Hypertension   . Thyroid disease     Napoleon Form 07/17/2016,7:42 AM

## 2016-07-17 NOTE — Progress Notes (Signed)
Discharge instructions and prescriptions received from Dr. Benjie Karvonen. These were reviewed with the patient and her daughter. Patient was instructed to follow-up with Dr. Iona Beard within 1 week, stop HCTZ, metformin, and insulin NPH-regular 70/30, and to start insulin aspart protamine novolog 70/30. They both verbalized that they had no questions. Patient was removed from cardiac monitor, IV's d/c'd, dressed herself; was taken via wheelchair to her daughter's vehicle at the visitor's entrance.

## 2016-08-03 ENCOUNTER — Other Ambulatory Visit: Payer: Self-pay | Admitting: Family Medicine

## 2017-01-27 ENCOUNTER — Other Ambulatory Visit: Payer: Self-pay | Admitting: Emergency Medicine

## 2017-01-27 DIAGNOSIS — Z1231 Encounter for screening mammogram for malignant neoplasm of breast: Secondary | ICD-10-CM

## 2017-02-22 ENCOUNTER — Ambulatory Visit
Admission: RE | Admit: 2017-02-22 | Discharge: 2017-02-22 | Disposition: A | Discharge status: Home / Self Care | Payer: Medicare Other | Source: Ambulatory Visit | Attending: Emergency Medicine | Admitting: Emergency Medicine

## 2017-02-22 DIAGNOSIS — Z1231 Encounter for screening mammogram for malignant neoplasm of breast: Secondary | ICD-10-CM | POA: Insufficient documentation

## 2017-06-16 ENCOUNTER — Other Ambulatory Visit: Payer: Self-pay | Admitting: Family Medicine

## 2017-06-16 DIAGNOSIS — R748 Abnormal levels of other serum enzymes: Secondary | ICD-10-CM

## 2017-06-27 ENCOUNTER — Ambulatory Visit
Admission: RE | Admit: 2017-06-27 | Discharge: 2017-06-27 | Disposition: A | Discharge status: Home / Self Care | Payer: Medicare Other | Source: Ambulatory Visit | Attending: Family Medicine | Admitting: Family Medicine

## 2017-06-27 DIAGNOSIS — K7689 Other specified diseases of liver: Secondary | ICD-10-CM | POA: Insufficient documentation

## 2017-06-27 DIAGNOSIS — R748 Abnormal levels of other serum enzymes: Secondary | ICD-10-CM | POA: Diagnosis present

## 2017-06-27 DIAGNOSIS — D892 Hypergammaglobulinemia, unspecified: Secondary | ICD-10-CM | POA: Diagnosis present

## 2017-07-28 ENCOUNTER — Other Ambulatory Visit: Payer: Self-pay

## 2017-07-28 ENCOUNTER — Inpatient Hospital Stay: Payer: Medicare Other

## 2017-07-28 ENCOUNTER — Inpatient Hospital Stay: Payer: Medicare Other | Attending: Hematology and Oncology | Admitting: Hematology and Oncology

## 2017-07-28 ENCOUNTER — Encounter (INDEPENDENT_AMBULATORY_CARE_PROVIDER_SITE_OTHER): Payer: Self-pay

## 2017-07-28 VITALS — BP 123/75 | HR 97 | Temp 97.0°F | Resp 18 | Ht 62.0 in | Wt 180.5 lb

## 2017-07-28 DIAGNOSIS — R7989 Other specified abnormal findings of blood chemistry: Secondary | ICD-10-CM | POA: Diagnosis not present

## 2017-07-28 DIAGNOSIS — R779 Abnormality of plasma protein, unspecified: Secondary | ICD-10-CM

## 2017-07-28 DIAGNOSIS — E119 Type 2 diabetes mellitus without complications: Secondary | ICD-10-CM | POA: Diagnosis not present

## 2017-07-28 DIAGNOSIS — D892 Hypergammaglobulinemia, unspecified: Secondary | ICD-10-CM

## 2017-07-28 DIAGNOSIS — I10 Essential (primary) hypertension: Secondary | ICD-10-CM | POA: Diagnosis not present

## 2017-07-28 DIAGNOSIS — R945 Abnormal results of liver function studies: Secondary | ICD-10-CM

## 2017-07-28 LAB — CBC WITH DIFFERENTIAL/PLATELET
Basophils Absolute: 0.1 10*3/uL (ref 0–0.1)
Basophils Relative: 1 %
EOS PCT: 2 %
Eosinophils Absolute: 0.1 10*3/uL (ref 0–0.7)
HEMATOCRIT: 36.6 % (ref 35.0–47.0)
Hemoglobin: 12.4 g/dL (ref 12.0–16.0)
LYMPHS PCT: 40 %
Lymphs Abs: 3 10*3/uL (ref 1.0–3.6)
MCH: 32.3 pg (ref 26.0–34.0)
MCHC: 33.8 g/dL (ref 32.0–36.0)
MCV: 95.5 fL (ref 80.0–100.0)
MONO ABS: 0.6 10*3/uL (ref 0.2–0.9)
MONOS PCT: 8 %
NEUTROS ABS: 3.7 10*3/uL (ref 1.4–6.5)
Neutrophils Relative %: 49 %
PLATELETS: 125 10*3/uL — AB (ref 150–440)
RBC: 3.83 MIL/uL (ref 3.80–5.20)
RDW: 14.1 % (ref 11.5–14.5)
WBC: 7.5 10*3/uL (ref 3.6–11.0)

## 2017-07-28 NOTE — Progress Notes (Signed)
Patient new evaluation regarding hypergammaglobulinemia. Referred by Dr. Dola Argyle. Patient offers no complaints today. Started on trulicity about a month ago.

## 2017-07-28 NOTE — Progress Notes (Signed)
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 Salome Bullock for assessment and management.  HPI:  The patient has elevated liver function tests.  She does not drink alcohol.  She is on Lipitor.  Normal labs on 06/10/2017 included:  Hepatitis B surface antigen, hepatitis B core antibody total, and hepatitis C antibody.  Hepatitis A antibody was positive.   LFTs on 06/10/2017 revealed an albumen of 3.6, protein 8.5 (high), bilirubin 0.8, AST 80, ALT 54, and GGT 71.  CMP on 07/17/2017 revealed a creatinine 0.81 and calcium 8.4.  CBC on 07/16/2016 revealed a hematocrit of 37.9, hemoglobin 13.0, MCV 91.3, platelets 139,000, and WBC 9900.  RUQ ultrasound on 06/27/2017 suggested hepatic steatosis with a 1.3 cm benign appearing hepatic cyst.  Notes indicate a history of hypergammaglobulinemia since 07/15/2015.  Serum protein has ranged between 8.4 - 9.1 (6.4 - 8.2) since 03/28/2012.  SPEP on 06/07/2012 revealed no M-spike.  IgG was 2287 (229 110 2012).  Random UPEP on 06/07/2012 was negative.  SPEP on 07/09/2015 revealed no monoclonal protein.  Gamma region was 1.79 (0.53-1.37).  SPEP on 07/08/2017 revealed no monoclonal protein.  Gamma region was 1.87.  Last colonoscopy was 10/01/2013. Mammogram on 02/22/2017 revealed no evidence of malignancy.  Symptomatically, she denies any issues with infections.  She denies gingivitis.  She denies any fever, sweats or weight loss.  Diet is "good".   Past Medical History:  Diagnosis Date  . Diabetes mellitus without complication ([place])   . Hyperlipidemia   . Hypertension   . Thyroid disease     Past Surgical History:  Procedure Laterality Date  . ANKLE FRACTURE SURGERY Left   . HARDWARE REMOVAL Left 09/17/2014   Procedure: HARDWARE REMOVAL/ANKLE;  Surgeon: Hessie Knows, MD;  Location: [facility] ORS;  Service:  Orthopedics;  Laterality: Left;    Family History  Problem Relation Age of Onset  . Breast cancer Neg Hx     Social History:  reports that she has never smoked. She has never used smokeless tobacco. She reports that she drinks alcohol. She reports that she does not use drugs.  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  She lives in [place].  The patient is alone today.  Allergies:  Allergies  Allergen Reactions  . Ace Inhibitors Cough and Other (See Comments)  . Metformin And Related Nausea Only    Pt takes this medication    Current Medications: Current Outpatient Medications  Medication Sig Dispense Refill  . aspirin 81 MG EC tablet Take 81 mg by mouth daily. Swallow whole.    Marland Kitchen atorvastatin (LIPITOR) 10 MG tablet Take 10 mg by mouth daily.  11  . Dulaglutide (TRULICITY) 0.62 IR/4.8NI SOPN Inject 0.75 mg into the skin every 7 (seven) days.    Marland Kitchen glipiZIDE (GLUCOTROL XL) 10 MG 24 hr tablet Take 1 tablet (10 mg total) by mouth 2 (two) times daily. 60 tablet 0  . hydrochlorothiazide (HYDRODIURIL) 25 MG tablet Take 25 mg by mouth daily.    . insulin NPH-regular Human (NOVOLIN 70/30) (70-30) 100 UNIT/ML injection Inject 80 Units into the skin every morning. 50 units daily- evenings    . metFORMIN (GLUCOPHAGE) 500 MG tablet Take 500 mg by mouth 2 (two) times daily.    Marland Kitchen telmisartan (MICARDIS) 40 MG tablet Take 40 mg by mouth daily.    . insulin aspart protamine-  aspart (NOVOLOG MIX 70/30) (70-30) 100 UNIT/ML injection Inject 0.65 mLs (65 Units total) into the skin 2 times daily at 12 noon and 4 pm. 39 mL 0   No current facility-administered medications for this visit.     Review of Systems:  GENERAL:  Feels good.  No fevers, sweats or weight loss. PERFORMANCE STATUS (ECOG):  0. HEENT:  No visual changes, runny nose, sore throat, mouth sores or tenderness. Lungs: No shortness of breath or cough.  No hemoptysis. Cardiac:  No chest pain, palpitations, orthopnea, or PND. GI:  No  nausea, vomiting, diarrhea, constipation, melena or hematochezia. GU:  No urgency, frequency, dysuria, or hematuria. Musculoskeletal:  No back pain.  No joint pain.  No muscle tenderness. Extremities:  No pain or swelling. Skin:  No rashes or skin changes. Neuro:  No headache, numbness or weakness, balance or coordination issues. Endocrine:  Diabetes.  No thyroid issues, hot flashes or night sweats. Psych:  No mood changes, depression or anxiety. Pain:  No focal pain. Review of systems:  All other systems reviewed and found to be negative.  Physical Exam: Blood pressure 123/75, pulse 97, temperature (!) 97 F (36.1 C), temperature source Tympanic, resp. rate 18, height _0  (1.575 m), weight 180 lb 8 oz (81.9 kg), SpO2 98 %. GENERAL:  Well developed, well nourished, woman sitting comfortably in the exam room in no acute distress. MENTAL STATUS:  Alert and oriented to person, place and time. HEAD:  Pearline Cables hair.  Normocephalic, atraumatic, face symmetric, no Cushingoid features. EYES:  Glasses.  Brown eyes.  Pupils equal round and reactive to light and accomodation.  No conjunctivitis or scleral icterus. ENT:  Oropharynx clear without lesion.  Tongue normal. Mucous membranes moist.  RESPIRATORY:  Clear to auscultation without rales, wheezes or rhonchi. CARDIOVASCULAR:  Regular rate and rhythm without murmur, rub or gallop. ABDOMEN:  Soft, non-tender, with active bowel sounds, and no appreciable hepatosplenomegaly.  No masses. SKIN:  Left medial ankle scar.  No rashes, ulcers or lesions. EXTREMITIES: No edema, no skin discoloration or tenderness.  No palpable cords. LYMPH NODES: No palpable cervical, supraclavicular, axillary or inguinal adenopathy  NEUROLOGICAL: Unremarkable. PSYCH:  Appropriate.   No visits with results within 3 Day(s) from this visit.  Latest known visit with results is:  Admission on 07/15/2016, Discharged on 07/17/2016  Component Date Value Ref Range Status  .  Glucose-Capillary 07/15/2016 >600* 65 - 99 mg/dL Final  . Sodium 07/15/2016 131* 135 - 145 mmol/L Final  . Potassium 07/15/2016 4.0  3.5 - 5.1 mmol/L Final  . Chloride 07/15/2016 94* 101 - 111 mmol/L Final  . CO2 07/15/2016 22  22 - 32 mmol/L Final  . Glucose, Bld 07/15/2016 943* 65 - 99 mg/dL Final   Comment: CRITICAL RESULT CALLED TO, READ BACK BY AND VERIFIED WITH CASEY ROBERTS @ 2326 ON 07/15/2016 BY CAF   . BUN 07/15/2016 21* 6 - 20 mg/dL Final  . Creatinine, Ser 07/15/2016 1.28* 0.44 - 1.00 mg/dL Final  . Calcium 07/15/2016 10.1  8.9 - 10.3 mg/dL Final  . GFR calc non Af Amer 07/15/2016 42* >60 mL/min Final  . GFR calc Af Amer 07/15/2016 49* >60 mL/min Final   Comment: (NOTE) The eGFR has been calculated using the CKD EPI equation. This calculation has not been validated in all clinical situations. eGFR's persistently <60 mL/min signify possible Chronic Kidney Disease.   . Anion gap 07/15/2016 15  5 - 15 Final  . WBC 07/15/2016 10.4  3.6 -  11.0 K/uL Final  . RBC 07/15/2016 4.78  3.80 - 5.20 MIL/uL Final  . Hemoglobin 07/15/2016 14.8  12.0 - 16.0 g/dL Final  . HCT 07/15/2016 45.6  35.0 - 47.0 % Final  . MCV 07/15/2016 95.3  80.0 - 100.0 fL Final  . MCH 07/15/2016 31.0  26.0 - 34.0 pg Final  . MCHC 07/15/2016 32.5  32.0 - 36.0 g/dL Final  . RDW 07/15/2016 13.9  11.5 - 14.5 % Final  . Platelets 07/15/2016 150  150 - 440 K/uL Final  . Color, Urine 07/16/2016 STRAW* YELLOW Final  . APPearance 07/16/2016 CLEAR* CLEAR Final  . Specific Gravity, Urine 07/16/2016 1.027  1.005 - 1.030 Final  . pH 07/16/2016 5.0  5.0 - 8.0 Final  . Glucose, UA 07/16/2016 >=500* NEGATIVE mg/dL Final  . Hgb urine dipstick 07/16/2016 NEGATIVE  NEGATIVE Final  . Bilirubin Urine 07/16/2016 NEGATIVE  NEGATIVE Final  . Ketones, ur 07/16/2016 5* NEGATIVE mg/dL Final  . Protein, ur 07/16/2016 NEGATIVE  NEGATIVE mg/dL Final  . Nitrite 07/16/2016 NEGATIVE  NEGATIVE Final  . Leukocytes, UA 07/16/2016 NEGATIVE   NEGATIVE Final  . RBC / HPF 07/16/2016 0-5  0 - 5 RBC/hpf Final  . WBC, UA 07/16/2016 0-5  0 - 5 WBC/hpf Final  . Bacteria, UA 07/16/2016 NONE SEEN  NONE SEEN Final  . Squamous Epithelial / LPF 07/16/2016 NONE SEEN  NONE SEEN Final  . Mucus 07/16/2016 PRESENT   Final  . pH, Ven 07/15/2016 7.37  7.250 - 7.430 Final  . pCO2, Ven 07/15/2016 44  44.0 - 60.0 mmHg Final  . pO2, Ven 07/15/2016 31.0* 32.0 - 45.0 mmHg Final  . Bicarbonate 07/15/2016 25.4  20.0 - 28.0 mmol/L Final  . Acid-base deficit 07/15/2016 0.2  0.0 - 2.0 mmol/L Final  . O2 Saturation 07/15/2016 57.2  % Final  . Patient temperature 07/15/2016 37.0   Final  . Collection site 07/15/2016 VEIN   Final  . Sample type 07/15/2016 VENOUS   Final  . Beta-Hydroxybutyric Acid 07/15/2016 0.50* 0.05 - 0.27 mmol/L Final  . Glucose-Capillary 07/16/2016 584* 65 - 99 mg/dL Final  . Comment 1 07/16/2016 Document in Chart   Final  . Glucose-Capillary 07/16/2016 447* 65 - 99 mg/dL Final  . Comment 1 07/16/2016 Notify RN   Final  . Sodium 07/16/2016 137  135 - 145 mmol/L Final  . Potassium 07/16/2016 3.6  3.5 - 5.1 mmol/L Final  . Chloride 07/16/2016 101  101 - 111 mmol/L Final  . CO2 07/16/2016 26  22 - 32 mmol/L Final  . Glucose, Bld 07/16/2016 453* 65 - 99 mg/dL Final  . BUN 07/16/2016 14  6 - 20 mg/dL Final  . Creatinine, Ser 07/16/2016 1.01* 0.44 - 1.00 mg/dL Final  . Calcium 07/16/2016 8.9  8.9 - 10.3 mg/dL Final  . GFR calc non Af Amer 07/16/2016 56* >60 mL/min Final  . GFR calc Af Amer 07/16/2016 >60  >60 mL/min Final   Comment: (NOTE) The eGFR has been calculated using the CKD EPI equation. This calculation has not been validated in all clinical situations. eGFR's persistently <60 mL/min signify possible Chronic Kidney Disease.   . Anion gap 07/16/2016 10  5 - 15 Final  . Sodium 07/16/2016 140  135 - 145 mmol/L Final  . Potassium 07/16/2016 3.2* 3.5 - 5.1 mmol/L Final  . Chloride 07/16/2016 107  101 - 111 mmol/L Final  . CO2  07/16/2016 27  22 - 32 mmol/L Final  . Glucose, Bld  07/16/2016 236* 65 - 99 mg/dL Final  . BUN 07/16/2016 12  6 - 20 mg/dL Final  . Creatinine, Ser 07/16/2016 0.92  0.44 - 1.00 mg/dL Final  . Calcium 07/16/2016 8.6* 8.9 - 10.3 mg/dL Final  . GFR calc non Af Amer 07/16/2016 >60  >60 mL/min Final  . GFR calc Af Amer 07/16/2016 >60  >60 mL/min Final   Comment: (NOTE) The eGFR has been calculated using the CKD EPI equation. This calculation has not been validated in all clinical situations. eGFR's persistently <60 mL/min signify possible Chronic Kidney Disease.   . Anion gap 07/16/2016 6  5 - 15 Final  . Sodium 07/16/2016 142  135 - 145 mmol/L Final  . Potassium 07/16/2016 3.3* 3.5 - 5.1 mmol/L Final  . Chloride 07/16/2016 107  101 - 111 mmol/L Final  . CO2 07/16/2016 27  22 - 32 mmol/L Final  . Glucose, Bld 07/16/2016 219* 65 - 99 mg/dL Final  . BUN 07/16/2016 11  6 - 20 mg/dL Final  . Creatinine, Ser 07/16/2016 0.96  0.44 - 1.00 mg/dL Final  . Calcium 07/16/2016 8.7* 8.9 - 10.3 mg/dL Final  . GFR calc non Af Amer 07/16/2016 59* >60 mL/min Final  . GFR calc Af Amer 07/16/2016 >60  >60 mL/min Final   Comment: (NOTE) The eGFR has been calculated using the CKD EPI equation. This calculation has not been validated in all clinical situations. eGFR's persistently <60 mL/min signify possible Chronic Kidney Disease.   . Anion gap 07/16/2016 8  5 - 15 Final  . Sodium 07/16/2016 137  135 - 145 mmol/L Final  . Potassium 07/16/2016 3.1* 3.5 - 5.1 mmol/L Final  . Chloride 07/16/2016 107  101 - 111 mmol/L Final  . CO2 07/16/2016 24  22 - 32 mmol/L Final  . Glucose, Bld 07/16/2016 201* 65 - 99 mg/dL Final  . BUN 07/16/2016 13  6 - 20 mg/dL Final  . Creatinine, Ser 07/16/2016 0.87  0.44 - 1.00 mg/dL Final  . Calcium 07/16/2016 8.5* 8.9 - 10.3 mg/dL Final  . GFR calc non Af Amer 07/16/2016 >60  >60 mL/min Final  . GFR calc Af Amer 07/16/2016 >60  >60 mL/min Final   Comment: (NOTE) The eGFR has  been calculated using the CKD EPI equation. This calculation has not been validated in all clinical situations. eGFR's persistently <60 mL/min signify possible Chronic Kidney Disease.   . Anion gap 07/16/2016 6  5 - 15 Final  . WBC 07/16/2016 9.9  3.6 - 11.0 K/uL Final  . RBC 07/16/2016 4.16  3.80 - 5.20 MIL/uL Final  . Hemoglobin 07/16/2016 13.0  12.0 - 16.0 g/dL Final  . HCT 07/16/2016 37.9  35.0 - 47.0 % Final  . MCV 07/16/2016 91.3  80.0 - 100.0 fL Final  . MCH 07/16/2016 31.3  26.0 - 34.0 pg Final  . MCHC 07/16/2016 34.3  32.0 - 36.0 g/dL Final  . RDW 07/16/2016 13.3  11.5 - 14.5 % Final  . Platelets 07/16/2016 139* 150 - 440 K/uL Final  . Magnesium 07/16/2016 1.9  1.7 - 2.4 mg/dL Final  . Phosphorus 07/16/2016 2.1* 2.5 - 4.6 mg/dL Final  . Beta-Hydroxybutyric Acid 07/16/2016 0.11  0.05 - 0.27 mmol/L Final  . Hgb A1c MFr Bld 07/16/2016 14.6* 4.8 - 5.6 % Final   Comment: (NOTE)         Pre-diabetes: 5.7 - 6.4         Diabetes: >6.4  Glycemic control for adults with diabetes: <7.0   . Mean Plasma Glucose 07/16/2016 372  mg/dL Final   Comment: (NOTE) Performed At: [facility] [place], [place] [id number] Lindon Romp MD HU:[id number]   . Glucose-Capillary 07/16/2016 414* 65 - 99 mg/dL Final  . Glucose-Capillary 07/16/2016 409* 65 - 99 mg/dL Final  . Glucose-Capillary 07/16/2016 342* 65 - 99 mg/dL Final  . Glucose-Capillary 07/16/2016 288* 65 - 99 mg/dL Final  . Glucose-Capillary 07/16/2016 272* 65 - 99 mg/dL Final  . Glucose-Capillary 07/16/2016 195* 65 - 99 mg/dL Final  . Glucose-Capillary 07/16/2016 144* 65 - 99 mg/dL Final  . Glucose-Capillary 07/16/2016 151* 65 - 99 mg/dL Final  . Glucose-Capillary 07/16/2016 205* 65 - 99 mg/dL Final  . MRSA by PCR 07/16/2016 NEGATIVE  NEGATIVE Final  . Glucose-Capillary 07/16/2016 321* 65 - 99 mg/dL Final  . Glucose-Capillary 07/16/2016 300* 65 - 99 mg/dL Final  . Glucose-Capillary 07/16/2016 250* 65  - 99 mg/dL Final  . Sodium 07/17/2016 135  135 - 145 mmol/L Final  . Potassium 07/17/2016 3.7  3.5 - 5.1 mmol/L Final  . Chloride 07/17/2016 105  101 - 111 mmol/L Final  . CO2 07/17/2016 23  22 - 32 mmol/L Final  . Glucose, Bld 07/17/2016 215* 65 - 99 mg/dL Final  . BUN 07/17/2016 10  6 - 20 mg/dL Final  . Creatinine, Ser 07/17/2016 0.81  0.44 - 1.00 mg/dL Final  . Calcium 07/17/2016 8.4* 8.9 - 10.3 mg/dL Final  . GFR calc non Af Amer 07/17/2016 >60  >60 mL/min Final  . GFR calc Af Amer 07/17/2016 >60  >60 mL/min Final   Comment: (NOTE) The eGFR has been calculated using the CKD EPI equation. This calculation has not been validated in all clinical situations. eGFR's persistently <60 mL/min signify possible Chronic Kidney Disease.   . Anion gap 07/17/2016 7  5 - 15 Final  . Glucose-Capillary 07/16/2016 154* 65 - 99 mg/dL Final  . Magnesium 07/17/2016 2.3  1.7 - 2.4 mg/dL Final  . Phosphorus 07/17/2016 5.3* 2.5 - 4.6 mg/dL Final  . Glucose-Capillary 07/16/2016 133* 65 - 99 mg/dL Final  . Comment 1 07/16/2016 Notify RN   Final  . Comment 2 07/16/2016 Document in Chart   Final  . Glucose-Capillary 07/16/2016 123* 65 - 99 mg/dL Final  . Glucose-Capillary 07/17/2016 196* 65 - 99 mg/dL Final  . 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  Serum protein has ranged between 8.4 - 9.1 (6.4 - 8.2) since 03/28/2012.  SPEP on 06/07/2012, 07/09/2015, and 07/08/2017 revealed no M-spike.  IgG was 2287 ([phone]).  Random UPEP on 06/07/2012 was negative.  Gamma region was 1.79 on 07/09/2015 and 1.87 on 07/08/2017.  She has elevated liver function tests.  She does not drink alcohol.  She is on Lipitor.  Normal labs on 06/10/2017 included: hepatitis B surface antigen, hepatitis B core antibody total, and hepatitis C antibody.  Hepatitis A antibody was positive.   LFTs on 06/10/2017 revealed an albumen of 3.6,  protein 8.5 (high), bilirubin 0.8, AST 80, ALT 54, and GGT 71.    RUQ ultrasound on 06/27/2017 suggested hepatic steatosis with a 1.3 cm benign appearing hepatic cyst.  Symptomatically, she denies any complaints.  Exam reveals no adenopathy or hepatosplenomegaly.  Plan: 1.  Discuss apparent polyclonal gammopathy.  Differential diagnosis includes infection, inflammatory disorders/connective tissue disease, or reactive processes. Review  of prior labs suggests this has been long standing (since at least 2014).  Patient's labs note mildy elevated LFTs.  Hepatitis B and C testing are negative.  Consider autoimmune hepatitis. 2.  Labs today:  CBC with diff, myeloma panel, FLCA, ANA with reflex, anti-smooth muscle antibodies, anti-mitochondrial antibody, ANCA, HIV testing.  Patient provides verbal consent for HIV testing.  3.  RTC in 1 week for MD assessment, review of work-up, and discussion regarding direction of therapy.     Honor Loh, NP  07/28/2017, 3:36 PM   I saw and evaluated the patient, participating in the key portions of the service and reviewing pertinent diagnostic studies and records.  I reviewed the nurse practitioner's note and agree with the findings and the plan.  The assessment and plan were discussed with the patient.  Several questions were asked by the patient and answered.   Nolon Stalls, MD 07/28/2017,3:36 PM

## 2017-07-29 LAB — HIV ANTIBODY (ROUTINE TESTING W REFLEX): HIV SCREEN 4TH GENERATION: NONREACTIVE

## 2017-07-29 LAB — ENA+DNA/DS+SJORGEN'S
ENA SM Ab Ser-aCnc: <0.2 AI (ref 0.0–0.9)
RIBONUCLEIC PROTEIN: 0.4 AI (ref 0.0–0.9)
SSA (RO) (ENA) ANTIBODY, IGG: 1 AI — AB (ref 0.0–0.9)
SSB (La) (ENA) Antibody, IgG: <0.2 AI (ref 0.0–0.9)

## 2017-07-29 LAB — ANCA TITERS

## 2017-07-29 LAB — MITOCHONDRIAL ANTIBODIES: MITOCHONDRIAL M2 AB, IGG: 37.1 U — AB (ref 0.0–20.0)

## 2017-07-29 LAB — ANA W/REFLEX: Anti Nuclear Antibody(ANA): POSITIVE — AB

## 2017-07-29 LAB — ANTI-SMOOTH MUSCLE ANTIBODY, IGG: F-ACTIN AB IGG: 12 U (ref 0–19)

## 2017-07-31 ENCOUNTER — Encounter: Payer: Self-pay | Admitting: Hematology and Oncology

## 2017-08-01 LAB — MULTIPLE MYELOMA PANEL, SERUM
ALBUMIN/GLOB SERPL: 0.8 (ref 0.7–1.7)
ALPHA2 GLOB SERPL ELPH-MCNC: 0.8 g/dL (ref 0.4–1.0)
Albumin SerPl Elph-Mcnc: 3.3 g/dL (ref 2.9–4.4)
Alpha 1: 0.2 g/dL (ref 0.0–0.4)
B-GLOBULIN SERPL ELPH-MCNC: 1.2 g/dL (ref 0.7–1.3)
Gamma Glob SerPl Elph-Mcnc: 2.2 g/dL — ABNORMAL HIGH (ref 0.4–1.8)
Globulin, Total: 4.5 g/dL — ABNORMAL HIGH (ref 2.2–3.9)
IGG (IMMUNOGLOBIN G), SERUM: 2222 mg/dL — AB (ref 700–1600)
IGM (IMMUNOGLOBULIN M), SRM: 125 mg/dL (ref 26–217)
IgA: 317 mg/dL (ref 87–352)
TOTAL PROTEIN ELP: 7.8 g/dL (ref 6.0–8.5)

## 2017-08-01 LAB — KAPPA/LAMBDA LIGHT CHAINS
KAPPA FREE LGHT CHN: 61.1 mg/L — AB (ref 3.3–19.4)
Kappa, lambda light chain ratio: 1.71 — ABNORMAL HIGH (ref 0.26–1.65)
Lambda free light chains: 35.8 mg/L — ABNORMAL HIGH (ref 5.7–26.3)

## 2017-08-05 ENCOUNTER — Inpatient Hospital Stay (HOSPITAL_BASED_OUTPATIENT_CLINIC_OR_DEPARTMENT_OTHER): Payer: Medicare Other | Admitting: Urgent Care

## 2017-08-05 ENCOUNTER — Telehealth: Payer: Self-pay | Admitting: *Deleted

## 2017-08-05 VITALS — BP 134/79 | HR 99 | Temp 96.9°F | Resp 18 | Wt 180.4 lb

## 2017-08-05 DIAGNOSIS — I10 Essential (primary) hypertension: Secondary | ICD-10-CM

## 2017-08-05 DIAGNOSIS — E119 Type 2 diabetes mellitus without complications: Secondary | ICD-10-CM

## 2017-08-05 DIAGNOSIS — R7989 Other specified abnormal findings of blood chemistry: Secondary | ICD-10-CM | POA: Diagnosis not present

## 2017-08-05 DIAGNOSIS — D892 Hypergammaglobulinemia, unspecified: Secondary | ICD-10-CM | POA: Diagnosis not present

## 2017-08-05 NOTE — Telephone Encounter (Signed)
Tried calling Dr. Marton Redwood (GI office) @ [phone] a message was left with Gyle she stated that she would make sure patient's info would be sent to Dr. Suzan Nailer to get Patient scheduled Per MD request.

## 2017-08-05 NOTE — Progress Notes (Signed)
Patient offers no complaints today. Patient here today for lab results.

## 2017-08-05 NOTE — Progress Notes (Signed)
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  HPI: Patient was last seen in the medical oncology clinic on 07/28/2017 for an initial consult following a referral from her primary care physician Dr. Iona Beard.  At that time, patient was doing well.  She denied any acute complaints.  She had no issues with bleeding or infections.  She denied B symptoms.  Exam revealed no adenopathy or hepatosplenomegaly.  Patient was sent for labs.  Labs done on 07/28/2017.  CBC showed a WBC of 7.5 with an [place] 3700.  Hemoglobin 12.4, hematocrit 36.6, MCV 95.5, and platelets 125,000.  ANA was positive. SSA (Ro) antibody IgG elevated at 1.0.  Multiple myeloma panel showed an elevated IgG level of 2,222.  Gammaglobulin elevated at 2.2.  Total globulin level elevated at 4.5.    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  Kappa free light chains elevated at 61.1.  Lambda free light chains elevated at 35.8.  Kappa lambda light chain ratio elevated at 1.71. ANCA normal.  HIV antibody nonreactive.  Mitochondrial antibodies elevated at 37.1.  Anti-smooth muscle antibody IgG normal.  In the interim, patient is doing well today. There are no acute concerns. Patient denies B symptoms and interval infections. Patient is eating well. Weight has remained stable. Patient denies bleeding; no hematochezia, melena, or gross hematuria.    Patient denies pain in the clinic today.    Past Medical History:  Diagnosis Date  . Diabetes mellitus without complication ([place])   . Hyperlipidemia   . Hypertension   . Thyroid disease     Past Surgical History:  Procedure Laterality Date  . ANKLE FRACTURE SURGERY Left   . HARDWARE REMOVAL Left 09/17/2014   Procedure: HARDWARE REMOVAL/ANKLE;  Surgeon: Hessie Knows, MD;  Location: [facility] ORS;  Service: Orthopedics;  Laterality:  Left;    Family History  Problem Relation Age of Onset  . Breast cancer Neg Hx     Social History:  reports that she has never smoked. She has never used smokeless tobacco. She reports that she drinks alcohol. She reports that she does not use drugs.  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  She lives in [place].  The patient is alone today.  Allergies:  Allergies  Allergen Reactions  . Ace Inhibitors Cough and Other (See Comments)  . Metformin And Related Nausea Only    Pt takes this medication    Current Medications: Current Outpatient Medications  Medication Sig Dispense Refill  . aspirin 81 MG EC tablet Take 81 mg by mouth daily. Swallow whole.    Marland Kitchen atorvastatin (LIPITOR) 10 MG tablet Take 10 mg by mouth daily.  11  . Dulaglutide (TRULICITY) 1.75 ZW/2.5EN SOPN Inject 0.75 mg into the skin every 7 (seven) days.    Marland Kitchen glipiZIDE (GLUCOTROL XL) 10 MG 24 hr tablet Take 1 tablet (10 mg total) by mouth 2 (two) times daily. 60 tablet 0  . hydrochlorothiazide (HYDRODIURIL) 25 MG tablet Take 25 mg by mouth daily.    . insulin NPH-regular Human (NOVOLIN 70/30) (70-30) 100 UNIT/ML injection Inject 80 Units into the skin every morning. 50 units daily- evenings    . metFORMIN (GLUCOPHAGE) 500 MG tablet Take 500 mg by mouth 2 (two) times daily.    Marland Kitchen telmisartan (MICARDIS) 40 MG tablet Take 40 mg by mouth  daily.    . insulin aspart protamine- aspart (NOVOLOG MIX 70/30) (70-30) 100 UNIT/ML injection Inject 0.65 mLs (65 Units total) into the skin 2 times daily at 12 noon and 4 pm. 39 mL 0   No current facility-administered medications for this visit.     Review of Systems:  GENERAL:  Feels good.  No fevers, sweats or weight loss. PERFORMANCE STATUS (ECOG):  0. HEENT:  No visual changes, runny nose, sore throat, mouth sores or tenderness. Lungs: No shortness of breath or cough.  No hemoptysis. Cardiac:  No chest pain, palpitations, orthopnea, or PND. GI:  No nausea, vomiting, diarrhea,  constipation, melena or hematochezia. GU:  No urgency, frequency, dysuria, or hematuria. Musculoskeletal:  No back pain.  No joint pain.  No muscle tenderness. Extremities:  No pain or swelling. Skin:  No rashes or skin changes. Neuro:  No headache, numbness or weakness, balance or coordination issues. Endocrine:  Diabetes.  No thyroid issues, hot flashes or night sweats. Psych:  No mood changes, depression or anxiety. Pain:  No focal pain. Review of systems:  All other systems reviewed and found to be negative.  Physical Exam: Blood pressure 134/79, pulse 99, temperature (!) 96.9 F (36.1 C), temperature source Tympanic, resp. rate 18, weight 180 lb 7 oz (81.8 kg). GENERAL:  Well developed, well nourished, woman sitting comfortably in the exam room in no acute distress. MENTAL STATUS:  Alert and oriented to person, place and time. HEAD:  Pearline Cables hair.  Normocephalic, atraumatic, face symmetric, no Cushingoid features. EYES:  Glasses.  Brown eyes.  Pupils equal round and reactive to light and accomodation.  No conjunctivitis or scleral icterus. ENT:  Oropharynx clear without lesion.  Tongue normal. Mucous membranes moist.  RESPIRATORY:  Clear to auscultation without rales, wheezes or rhonchi. CARDIOVASCULAR:  Regular rate and rhythm without murmur, rub or gallop. ABDOMEN:  Soft, non-tender, with active bowel sounds, and no appreciable hepatosplenomegaly.  No masses. SKIN:  Left medial ankle scar.  No rashes, ulcers or lesions. EXTREMITIES: No edema, no skin discoloration or tenderness.  No palpable cords. LYMPH NODES: No palpable cervical, supraclavicular, axillary or inguinal adenopathy  NEUROLOGICAL: Unremarkable. PSYCH:  Appropriate.   No visits with results within 3 Day(s) from this visit.  Latest known visit with results is:  Clinical Support on 07/28/2017  Component Date Value Ref Range Status  . Kappa free light chain 07/28/2017 61.1* 3.3 - 19.4 mg/L Final  . Lamda free light  chains 07/28/2017 35.8* 5.7 - 26.3 mg/L Final  . Kappa, lamda light chain ratio 07/28/2017 1.71* 0.26 - 1.65 Final   Comment: (NOTE) Performed At: [facility] [place], [place] [id number] Rush Farmer MD [phone] Performed at [facility], [address], [place], [place] [address]   . HIV Screen 4th Generation wRfx 07/28/2017 Non Reactive  Non Reactive Final   Comment: (NOTE) Performed At: [facility] [place], [place] [id number] Rush Farmer MD HC:[id number] Performed at [facility], [address], [place], [place] [address]   . C-ANCA 07/28/2017 <1:20  Neg:<1:20 titer Final  . P-ANCA 07/28/2017 <1:20  Neg:<1:20 titer Final   Comment: (NOTE) The presence of positive fluorescence exhibiting P-ANCA or C-ANCA patterns alone is not specific for the diagnosis of Wegener's Granulomatosis (WG) or microscopic polyangiitis. Decisions about treatment should not be based solely on ANCA IFA results.  The International ANCA Group Consensus recommends follow up testing of positive sera with both PR-3 and MPO-ANCA enzyme  immunoassays. As many as 5% serum samples are positive only by EIA. Ref. AM J Clin Pathol 1999;111:507-513.   Marland Kitchen Atypical P-ANCA titer 07/28/2017 <1:20  Neg:<1:20 titer Final   Comment: (NOTE) The atypical pANCA pattern has been observed in a significant percentage of patients with ulcerative colitis, primary sclerosing cholangitis and autoimmune hepatitis. Performed At: [facility] [place], [place] [id number] Rush Farmer MD [id number] Performed at [facility], [address], [place], [place] [address]   . Anit Nuclear Antibody(ANA) 07/28/2017 Positive* Negative Final   Comment: (NOTE) Performed At: [facility] [place], [place] [id number] Rush Farmer MD AQ:[id number] Performed at [facility], [address], [place],  [place] [address]   . IgG (Immunoglobin G), Serum 07/28/2017 2,222* 700 - 1,600 mg/dL Final  . IgA 07/28/2017 317  87 - 352 mg/dL Final  . IgM (Immunoglobulin M), Srm 07/28/2017 125  26 - 217 mg/dL Final  . Total Protein ELP 07/28/2017 7.8  6.0 - 8.5 g/dL Corrected  . Albumin SerPl Elph-Mcnc 07/28/2017 3.3  2.9 - 4.4 g/dL Corrected  . Alpha 1 07/28/2017 0.2  0.0 - 0.4 g/dL Corrected  . Alpha2 Glob SerPl Elph-Mcnc 07/28/2017 0.8  0.4 - 1.0 g/dL Corrected  . B-Globulin SerPl Elph-Mcnc 07/28/2017 1.2  0.7 - 1.3 g/dL Corrected  . Gamma Glob SerPl Elph-Mcnc 07/28/2017 2.2* 0.4 - 1.8 g/dL Corrected  . M Protein SerPl Elph-Mcnc 07/28/2017 Not Observed  Not Observed g/dL Corrected  . Globulin, Total 07/28/2017 4.5* 2.2 - 3.9 g/dL Corrected  . Albumin/Glob SerPl 07/28/2017 0.8  0.7 - 1.7 Corrected  . IFE 1 07/28/2017 Comment   Corrected   Comment: (NOTE) An apparent polyclonal gammopathy: IgG. Kappa and lambda typing appear increased.   . Please Note 07/28/2017 Comment   Corrected   Comment: (NOTE) Protein electrophoresis scan will follow via computer, mail, or courier delivery. Performed At: [facility] [place], [place] [id number] Rush Farmer MD JG:[id number] Performed at [facility], [address] [place], [place] [address]   . WBC 07/28/2017 7.5  3.6 - 11.0 K/uL Final  . RBC 07/28/2017 3.83  3.80 - 5.20 MIL/uL Final  . Hemoglobin 07/28/2017 12.4  12.0 - 16.0 g/dL Final  . HCT 07/28/2017 36.6  35.0 - 47.0 % Final  . MCV 07/28/2017 95.5  80.0 - 100.0 fL Final  . MCH 07/28/2017 32.3  26.0 - 34.0 pg Final  . MCHC 07/28/2017 33.8  32.0 - 36.0 g/dL Final  . RDW 07/28/2017 14.1  11.5 - 14.5 % Final  . Platelets 07/28/2017 125* 150 - 440 K/uL Final  . Neutrophils Relative % 07/28/2017 49  % Final  . Neutro Abs 07/28/2017 3.7  1.4 - 6.5 K/uL Final  . Lymphocytes Relative 07/28/2017 40  % Final  . Lymphs Abs 07/28/2017 3.0  1.0 - 3.6 K/uL Final  . Monocytes  Relative 07/28/2017 8  % Final  . Monocytes Absolute 07/28/2017 0.6  0.2 - 0.9 K/uL Final  . Eosinophils Relative 07/28/2017 2  % Final  . Eosinophils Absolute 07/28/2017 0.1  0 - 0.7 K/uL Final  . Basophils Relative 07/28/2017 1  % Final  . Basophils Absolute 07/28/2017 0.1  0 - 0.1 K/uL Final   Performed at [facility], [address] [place], [place] [address]  . ds DNA Ab 07/28/2017 <1  0 - 9 IU/mL Final   Comment: (NOTE)  Negative      <5                                   Equivocal  5 - 9                                   Positive      >9   . Ribonucleic Protein 07/28/2017 0.4  0.0 - 0.9 AI Final  . ENA SM Ab Ser-aCnc 07/28/2017 <0.2  0.0 - 0.9 AI Final  . SSA (Ro) (ENA) Antibody, IgG 07/28/2017 1.0* 0.0 - 0.9 AI Final  . SSB (La) (ENA) Antibody, IgG 07/28/2017 <0.2  0.0 - 0.9 AI Final  . See below: 07/28/2017 Comment   Final   Comment: (NOTE) Autoantibody                       Disease Association ------------------------------------------------------------                        Condition                  Frequency ---------------------   ------------------------   --------- Antinuclear Antibody,    SLE, mixed connective Direct (ANA-D)           tissue diseases ---------------------   ------------------------   --------- dsDNA                    SLE                        40 - 60% ---------------------   ------------------------   --------- Chromatin                Drug induced SLE                90%                         SLE                        48 - 97% ---------------------   ------------------------   --------- SSA (Ro)                 SLE                        25 - 35%                         Sjogren's Syndrome         40 - 70%                         Neonatal Lupus                 100% ---------------------   ------------------------   --------- SSB (La)                 SLE  10%                         Sjogren's Syndrome              30% ---------------------   -----------------------    --------- Sm (anti-Smith)          SLE                        15 - 30% ---------------------   -----------------------    --------- RNP                      Mixed Connective Tissue                         Disease                         95% (U1 nRNP,                SLE                        30 - 50% anti-ribonucleoprotein)  Polymyositis and/or                         Dermatomyositis                 20% ---------------------   ------------------------   --------- Scl-70 (antiDNA          Scleroderma (diffuse)      20 - 35% topoisomerase)           Crest                           13% ---------------------   ------------------------   --------- Jo-1                     Polymyositis and/or                         Dermatomyositis            20 - 40% ---------------------   ------------------------   --------- Centromere B             Scleroderma -                           Crest                         variant                         80% Performed At: [facility] [place], [place] [id number] Rush Farmer MD WU:[id number] Performed at [facility], [address], [place], [place] [address]     Assessment:  [name] is a 72 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  Serum protein has ranged between 8.4 - 9.1 (6.4 - 8.2) since 03/28/2012.  SPEP on 06/07/2012, 07/09/2015, 07/08/2017, and 07/28/2017 revealed no M-spike.  IgG was 2287 ([phone]).  Random UPEP on 06/07/2012 was negative.  Gamma region was 1.79 on 07/09/2015, 1.87 on 07/08/2017, and 2.2 on 07/28/2017.  She has elevated liver function tests.  She does  not drink alcohol.  She is on Lipitor.  Normal labs on 06/10/2017 included: hepatitis B surface antigen, hepatitis B core antibody total, and hepatitis C antibody.  Hepatitis A antibody was positive.   LFTs on 06/10/2017  revealed an albumin of 3.6, protein 8.5 (high), bilirubin 0.8, AST 80, ALT 54, and GGT 71.    Workup on 07/28/2017.  CBC showed a WBC of 7.5 with an [place] 3700.  ANA was positive. SSA (Ro) antibody IgG elevated at 1.0.  IgG level of 2,222.  Gammaglobulin elevated at 2.2.  Total globulin level elevated at 4.5. 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  Kappa free light chains elevated at 61.1.  Lambda free light chains elevated at 35.8.  Kappa lambda light chain ratio elevated at 1.71. ANCA normal.  HIV antibody nonreactive.  Mitochondrial antibodies elevated at 37.1.  Anti-smooth muscle antibody IgG normal.  RUQ ultrasound on 06/27/2017 suggested hepatic steatosis with a 1.3 cm benign appearing hepatic cyst.  Symptomatically, patient is doing well. She denies any complaints. She has not experienced any bleeding or recent infections.  Exam reveals no adenopathy or hepatosplenomegaly.  Plan: 1.  Review labs from 07/28/2017. 2.  Discuss apparent polyclonal gammopathy.  Differential diagnosis includes infection, inflammatory disorders/connective tissue disease, or reactive processes. Review of prior labs suggests this has been long standing (since at least 2014). Hepatitis B and C testing are negative.  Consider autoimmune hepatitis. Will refer to GI for further evaluation. Referral sent to Dr. Gustavo Lah.  3.  RTC PRN  Honor Loh, NP  08/05/2017, 1:40 PM

## 2018-01-18 ENCOUNTER — Other Ambulatory Visit: Payer: Self-pay | Admitting: Student

## 2018-01-18 DIAGNOSIS — R740 Nonspecific elevation of levels of transaminase and lactic acid dehydrogenase [LDH]: Principal | ICD-10-CM

## 2018-01-18 DIAGNOSIS — D696 Thrombocytopenia, unspecified: Secondary | ICD-10-CM

## 2018-01-18 DIAGNOSIS — R748 Abnormal levels of other serum enzymes: Secondary | ICD-10-CM

## 2018-01-18 DIAGNOSIS — R7401 Elevation of levels of liver transaminase levels: Secondary | ICD-10-CM

## 2018-01-30 ENCOUNTER — Other Ambulatory Visit: Payer: Self-pay

## 2018-01-31 ENCOUNTER — Other Ambulatory Visit: Payer: Self-pay | Admitting: Family Medicine

## 2018-01-31 DIAGNOSIS — E041 Nontoxic single thyroid nodule: Secondary | ICD-10-CM

## 2018-01-31 DIAGNOSIS — Z9641 Presence of insulin pump (external) (internal): Secondary | ICD-10-CM

## 2018-01-31 DIAGNOSIS — E119 Type 2 diabetes mellitus without complications: Secondary | ICD-10-CM

## 2018-02-03 ENCOUNTER — Ambulatory Visit
Admission: RE | Admit: 2018-02-03 | Discharge: 2018-02-03 | Disposition: A | Discharge status: Home / Self Care | Payer: Medicare Other | Source: Ambulatory Visit | Attending: Student | Admitting: Student

## 2018-02-03 DIAGNOSIS — D696 Thrombocytopenia, unspecified: Secondary | ICD-10-CM | POA: Insufficient documentation

## 2018-02-03 DIAGNOSIS — R740 Nonspecific elevation of levels of transaminase and lactic acid dehydrogenase [LDH]: Secondary | ICD-10-CM | POA: Diagnosis present

## 2018-02-03 DIAGNOSIS — K760 Fatty (change of) liver, not elsewhere classified: Secondary | ICD-10-CM | POA: Diagnosis not present

## 2018-02-03 DIAGNOSIS — R748 Abnormal levels of other serum enzymes: Secondary | ICD-10-CM | POA: Insufficient documentation

## 2018-02-03 DIAGNOSIS — Q453 Other congenital malformations of pancreas and pancreatic duct: Secondary | ICD-10-CM | POA: Insufficient documentation

## 2018-02-03 DIAGNOSIS — K7689 Other specified diseases of liver: Secondary | ICD-10-CM | POA: Diagnosis not present

## 2018-02-03 DIAGNOSIS — R160 Hepatomegaly, not elsewhere classified: Secondary | ICD-10-CM | POA: Diagnosis not present

## 2018-02-03 DIAGNOSIS — R7401 Elevation of levels of liver transaminase levels: Secondary | ICD-10-CM

## 2018-02-03 MED ORDER — GADOBUTROL 1 MMOL/ML IV SOLN
8.0000 mL | Freq: Once | INTRAVENOUS | Status: AC | PRN
  Administered 2018-02-03: 8 mL via INTRAVENOUS

## 2018-02-10 ENCOUNTER — Ambulatory Visit
Admission: RE | Admit: 2018-02-10 | Discharge: 2018-02-10 | Disposition: A | Discharge status: Home / Self Care | Payer: Medicare Other | Source: Ambulatory Visit | Attending: Family Medicine | Admitting: Family Medicine

## 2018-02-10 DIAGNOSIS — E119 Type 2 diabetes mellitus without complications: Secondary | ICD-10-CM | POA: Diagnosis not present

## 2018-02-10 DIAGNOSIS — E041 Nontoxic single thyroid nodule: Secondary | ICD-10-CM | POA: Diagnosis not present

## 2018-02-10 DIAGNOSIS — Z9641 Presence of insulin pump (external) (internal): Secondary | ICD-10-CM | POA: Insufficient documentation

## 2018-05-02 ENCOUNTER — Other Ambulatory Visit: Payer: Self-pay | Admitting: Family Medicine

## 2018-05-02 DIAGNOSIS — Z1231 Encounter for screening mammogram for malignant neoplasm of breast: Secondary | ICD-10-CM

## 2018-05-02 DIAGNOSIS — E119 Type 2 diabetes mellitus without complications: Secondary | ICD-10-CM

## 2018-05-02 DIAGNOSIS — Z780 Asymptomatic menopausal state: Secondary | ICD-10-CM

## 2018-05-02 DIAGNOSIS — Z794 Long term (current) use of insulin: Secondary | ICD-10-CM

## 2018-05-24 ENCOUNTER — Ambulatory Visit
Admission: RE | Admit: 2018-05-24 | Discharge: 2018-05-24 | Disposition: A | Discharge status: Home / Self Care | Payer: Medicare Other | Source: Ambulatory Visit | Attending: Family Medicine | Admitting: Family Medicine

## 2018-05-24 DIAGNOSIS — Z780 Asymptomatic menopausal state: Secondary | ICD-10-CM | POA: Insufficient documentation

## 2018-05-24 DIAGNOSIS — Z1231 Encounter for screening mammogram for malignant neoplasm of breast: Secondary | ICD-10-CM | POA: Insufficient documentation

## 2018-05-24 DIAGNOSIS — E119 Type 2 diabetes mellitus without complications: Secondary | ICD-10-CM | POA: Insufficient documentation

## 2018-05-24 DIAGNOSIS — Z794 Long term (current) use of insulin: Secondary | ICD-10-CM | POA: Insufficient documentation

## 2018-07-07 ENCOUNTER — Encounter: Admission: RE | Payer: Self-pay | Source: Home / Self Care

## 2018-07-07 ENCOUNTER — Ambulatory Visit
Admission: RE | Admit: 2018-07-07 | Payer: Medicare Other | Source: Home / Self Care | Admitting: Unknown Physician Specialty

## 2018-07-07 SURGERY — EGD (ESOPHAGOGASTRODUODENOSCOPY)
Anesthesia: General

## 2019-07-18 ENCOUNTER — Other Ambulatory Visit: Payer: Self-pay | Admitting: Family Medicine

## 2019-07-18 DIAGNOSIS — Z1231 Encounter for screening mammogram for malignant neoplasm of breast: Secondary | ICD-10-CM

## 2019-08-14 ENCOUNTER — Ambulatory Visit
Admission: RE | Admit: 2019-08-14 | Discharge: 2019-08-14 | Disposition: A | Discharge status: Home / Self Care | Payer: Medicare Other | Source: Ambulatory Visit | Attending: Family Medicine | Admitting: Family Medicine

## 2019-08-14 ENCOUNTER — Other Ambulatory Visit: Payer: Self-pay

## 2019-08-14 DIAGNOSIS — Z1231 Encounter for screening mammogram for malignant neoplasm of breast: Secondary | ICD-10-CM | POA: Insufficient documentation

## 2019-10-22 IMAGING — US US ABDOMEN LIMITED
1 series · 14 of 25 positions shown · non-contrast
Comparison: None.

CLINICAL DATA: Elevated LFTs

EXAM:
ULTRASOUND ABDOMEN LIMITED RIGHT UPPER QUADRANT

[Series 1: us abdomen limited · 0.19mm/px · 14 of 48 slices shown]
[im 1/48]
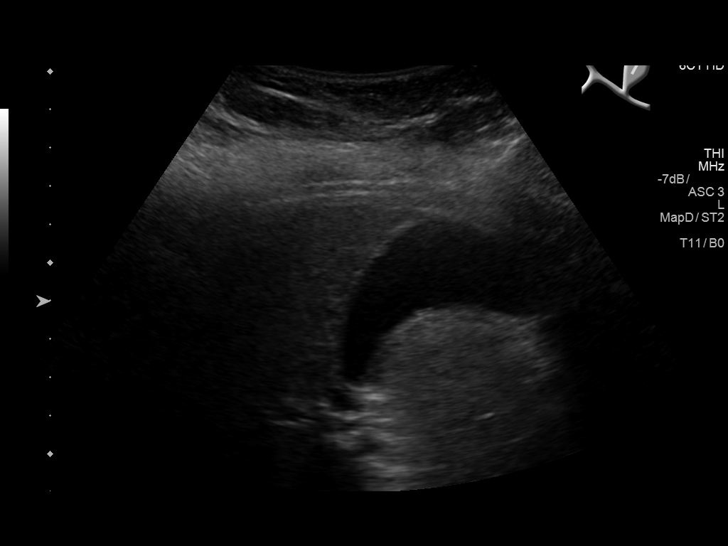
[im 4/48]
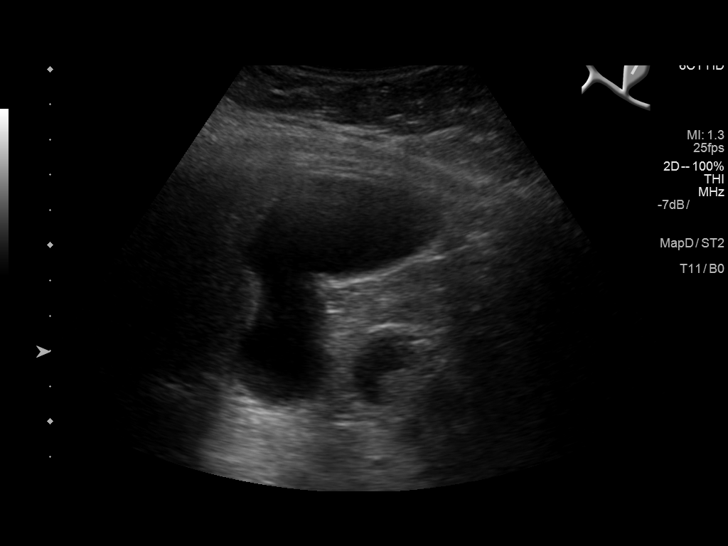
[im 8/48]
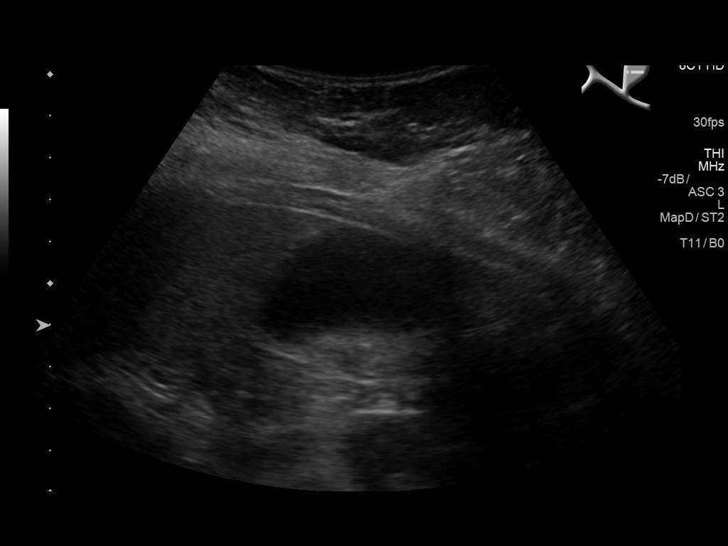
[im 12/48]
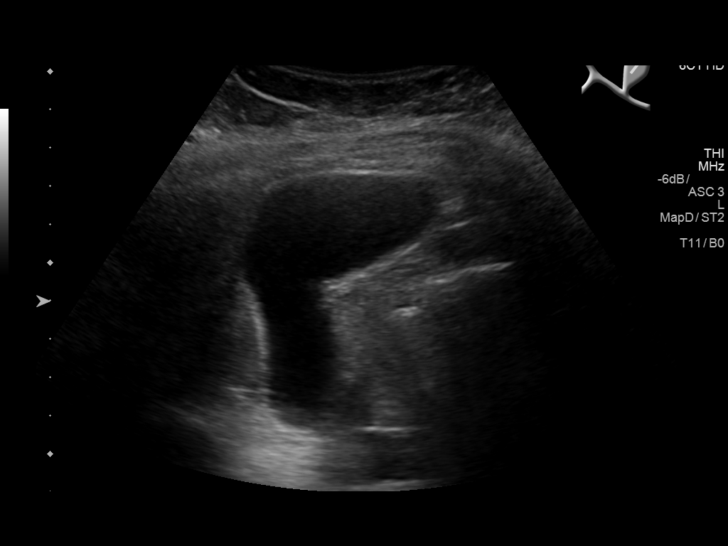
[im 16/48]
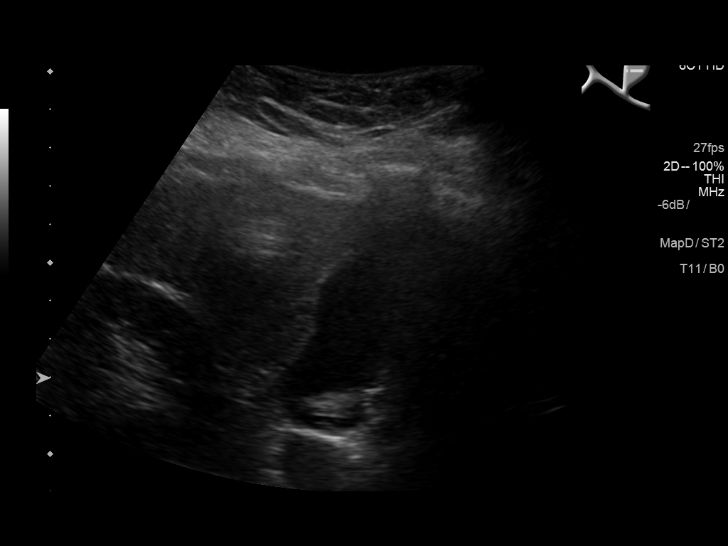
[im 18/48]
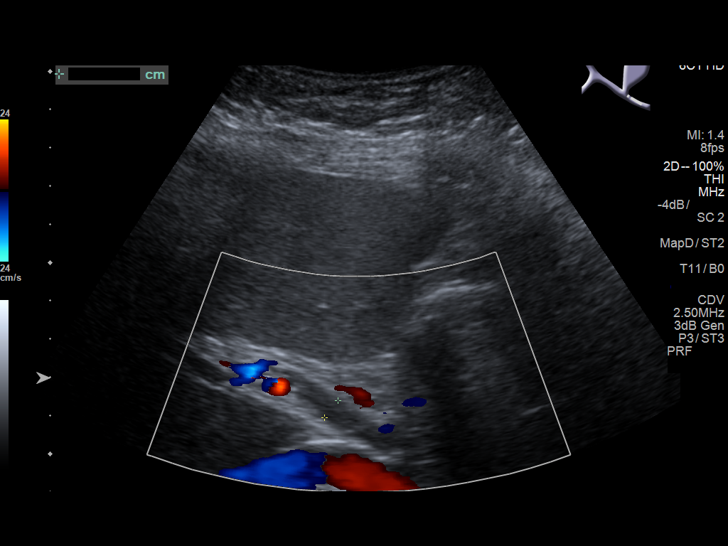
[im 22/48]
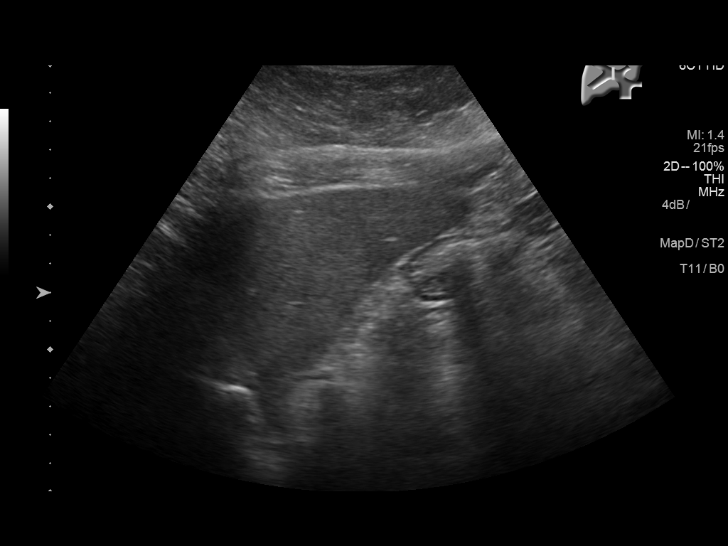
[im 26/48]
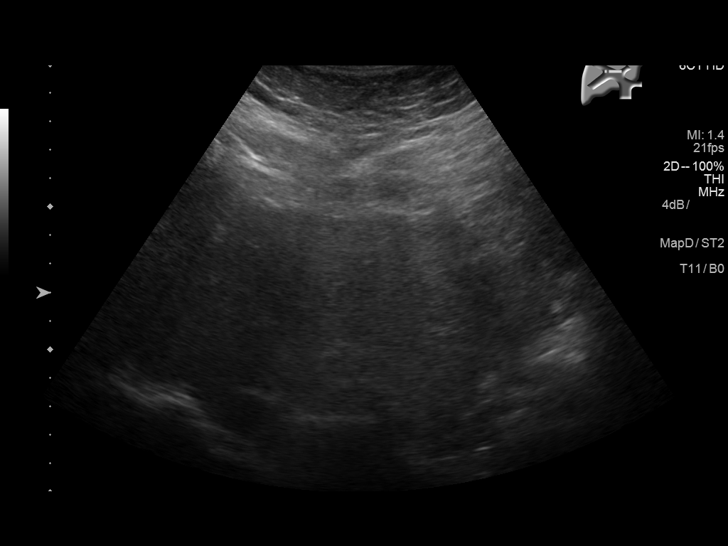
[im 30/48]
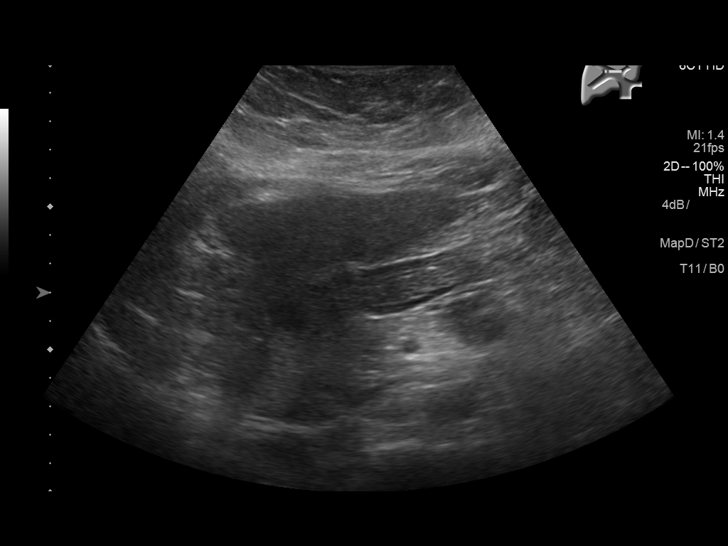
[im 32/48]
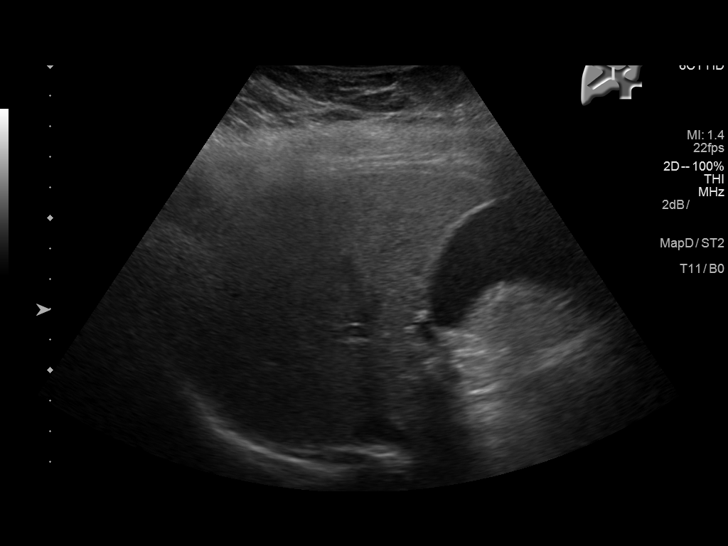
[im 36/48]
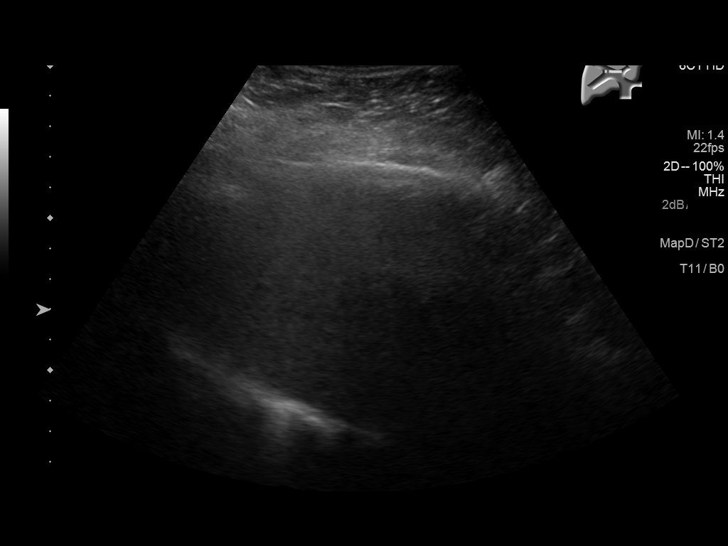
[im 40/48]
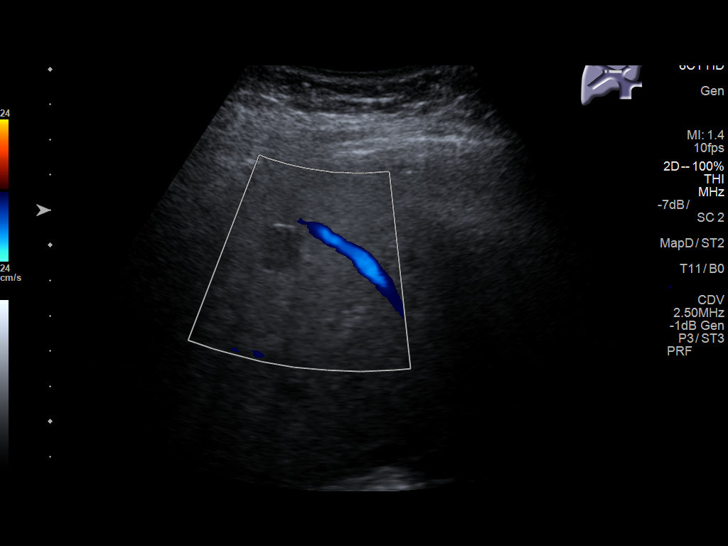
[im 44/48]
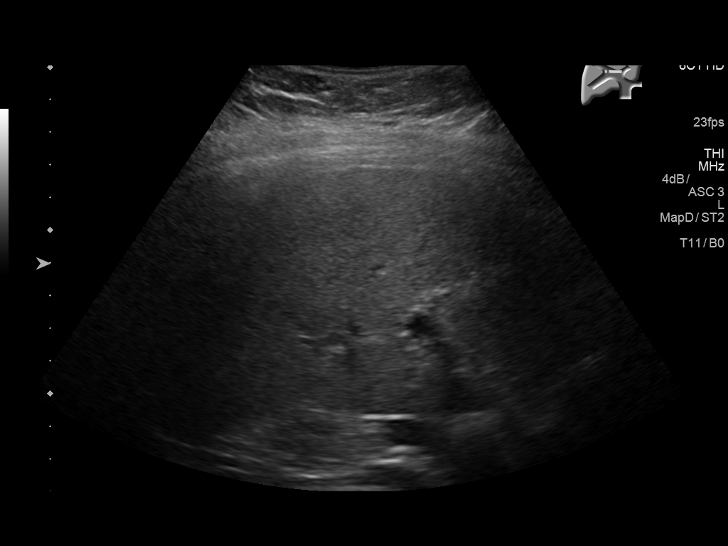
[im 48/48]
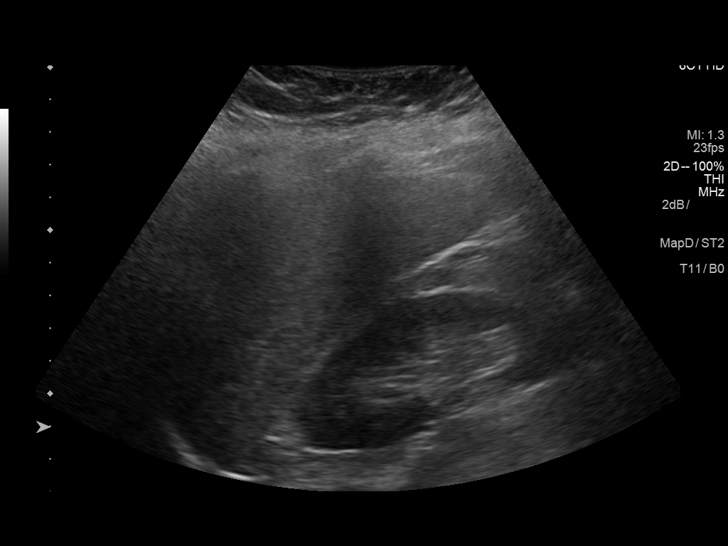

[14 of 25 positions shown; findings below may reference images not displayed]

FINDINGS: Gallbladder:

Normal sonographic appearance of the gallbladder. No gallbladder
wall thickening or pericholecystic fluid. No echogenic gallstones or
biliary sludge. Negative sonographic Murphy's sign.

Common bile duct:

Diameter: Normal in size measuring 5.7 mm in diameter

Liver:

There is mild diffuse increased echogenicity of the hepatic
parenchyma (image 51), suggestive of hepatic steatosis. Note is made
of a approximately 1.3 x 1.0 x 1.2 cm anechoic cyst within right
lobe of the liver which demonstrates a thin internal echogenic
septation though appears well-defined with increased through
transmission (representative images 44 and 46). No intrahepatic
biliary duct dilatation. No ascites. Portal vein is patent on color
Doppler imaging with normal direction of blood flow towards the
liver.
IMPRESSION: 1. Findings suggestive of hepatic steatosis.
2. Approximately 1.3 cm benign-appearing hepatic cyst.

## 2019-11-29 ENCOUNTER — Other Ambulatory Visit: Payer: Self-pay | Admitting: Family Medicine

## 2019-11-29 DIAGNOSIS — Z780 Asymptomatic menopausal state: Secondary | ICD-10-CM

## 2019-12-19 ENCOUNTER — Other Ambulatory Visit
Admission: RE | Admit: 2019-12-19 | Discharge: 2019-12-19 | Disposition: A | Discharge status: Home / Self Care | Payer: Medicare Other | Source: Ambulatory Visit | Attending: Gastroenterology | Admitting: Gastroenterology

## 2019-12-19 ENCOUNTER — Other Ambulatory Visit: Payer: Self-pay

## 2019-12-19 DIAGNOSIS — Z20822 Contact with and (suspected) exposure to covid-19: Secondary | ICD-10-CM | POA: Diagnosis not present

## 2019-12-19 DIAGNOSIS — Z01812 Encounter for preprocedural laboratory examination: Secondary | ICD-10-CM | POA: Insufficient documentation

## 2019-12-19 LAB — SARS CORONAVIRUS 2 (TAT 6-24 HRS): SARS Coronavirus 2: NEGATIVE

## 2019-12-20 ENCOUNTER — Encounter: Payer: Self-pay | Admitting: Internal Medicine

## 2019-12-21 ENCOUNTER — Encounter
Admission: RE | Disposition: A | Discharge status: Home / Self Care | Payer: Self-pay | Source: Ambulatory Visit | Attending: Gastroenterology

## 2019-12-21 ENCOUNTER — Ambulatory Visit: Payer: Medicare Other | Admitting: Anesthesiology

## 2019-12-21 ENCOUNTER — Other Ambulatory Visit: Payer: Self-pay

## 2019-12-21 ENCOUNTER — Ambulatory Visit
Admission: RE | Admit: 2019-12-21 | Discharge: 2019-12-21 | Disposition: A | Discharge status: Home / Self Care | Payer: Medicare Other | Source: Ambulatory Visit | Attending: Gastroenterology | Admitting: Gastroenterology

## 2019-12-21 ENCOUNTER — Encounter: Payer: Self-pay | Admitting: Internal Medicine

## 2019-12-21 DIAGNOSIS — E119 Type 2 diabetes mellitus without complications: Secondary | ICD-10-CM | POA: Insufficient documentation

## 2019-12-21 DIAGNOSIS — E7800 Pure hypercholesterolemia, unspecified: Secondary | ICD-10-CM | POA: Diagnosis not present

## 2019-12-21 DIAGNOSIS — Z794 Long term (current) use of insulin: Secondary | ICD-10-CM | POA: Diagnosis not present

## 2019-12-21 DIAGNOSIS — R112 Nausea with vomiting, unspecified: Secondary | ICD-10-CM | POA: Insufficient documentation

## 2019-12-21 DIAGNOSIS — D175 Benign lipomatous neoplasm of intra-abdominal organs: Secondary | ICD-10-CM | POA: Diagnosis not present

## 2019-12-21 DIAGNOSIS — E785 Hyperlipidemia, unspecified: Secondary | ICD-10-CM | POA: Insufficient documentation

## 2019-12-21 DIAGNOSIS — Z79899 Other long term (current) drug therapy: Secondary | ICD-10-CM | POA: Insufficient documentation

## 2019-12-21 DIAGNOSIS — R1013 Epigastric pain: Secondary | ICD-10-CM | POA: Insufficient documentation

## 2019-12-21 DIAGNOSIS — R197 Diarrhea, unspecified: Secondary | ICD-10-CM | POA: Diagnosis not present

## 2019-12-21 DIAGNOSIS — Z1211 Encounter for screening for malignant neoplasm of colon: Secondary | ICD-10-CM | POA: Diagnosis not present

## 2019-12-21 DIAGNOSIS — Z7982 Long term (current) use of aspirin: Secondary | ICD-10-CM | POA: Insufficient documentation

## 2019-12-21 DIAGNOSIS — K635 Polyp of colon: Secondary | ICD-10-CM | POA: Diagnosis not present

## 2019-12-21 DIAGNOSIS — Z8371 Family history of colonic polyps: Secondary | ICD-10-CM | POA: Insufficient documentation

## 2019-12-21 DIAGNOSIS — K5730 Diverticulosis of large intestine without perforation or abscess without bleeding: Secondary | ICD-10-CM | POA: Diagnosis not present

## 2019-12-21 DIAGNOSIS — I10 Essential (primary) hypertension: Secondary | ICD-10-CM | POA: Diagnosis not present

## 2019-12-21 HISTORY — PX: ESOPHAGOGASTRODUODENOSCOPY (EGD) WITH PROPOFOL: SHX5813

## 2019-12-21 HISTORY — DX: Other specified disorders of bone density and structure, unspecified site: M85.80

## 2019-12-21 HISTORY — PX: COLONOSCOPY WITH PROPOFOL: SHX5780

## 2019-12-21 HISTORY — DX: Pure hypercholesterolemia, unspecified: E78.00

## 2019-12-21 HISTORY — DX: Vitamin D deficiency, unspecified: E55.9

## 2019-12-21 HISTORY — DX: Fatty (change of) liver, not elsewhere classified: K76.0

## 2019-12-21 HISTORY — DX: Hypergammaglobulinemia, unspecified: D89.2

## 2019-12-21 HISTORY — DX: Cardiac murmur, unspecified: R01.1

## 2019-12-21 LAB — GLUCOSE, CAPILLARY: Glucose-Capillary: 161 mg/dL — ABNORMAL HIGH (ref 70–99)

## 2019-12-21 SURGERY — ESOPHAGOGASTRODUODENOSCOPY (EGD) WITH PROPOFOL
Anesthesia: General

## 2019-12-21 MED ORDER — SODIUM CHLORIDE 0.9 % IV SOLN: INTRAVENOUS | Status: DC

## 2019-12-21 MED ORDER — LIDOCAINE HCL (CARDIAC) PF 100 MG/5ML IV SOSY
PREFILLED_SYRINGE | INTRAVENOUS | Status: DC | PRN
  Administered 2019-12-21: 100 mg via INTRAVENOUS

## 2019-12-21 MED ORDER — PROPOFOL 10 MG/ML IV BOLUS
INTRAVENOUS | Status: DC | PRN
  Administered 2019-12-21 (×5): 30 mg via INTRAVENOUS
  Administered 2019-12-21: 40 mg via INTRAVENOUS
  Administered 2019-12-21: 30 mg via INTRAVENOUS
  Administered 2019-12-21: 100 mg via INTRAVENOUS

## 2019-12-21 MED ORDER — PHENYLEPHRINE HCL (PRESSORS) 10 MG/ML IV SOLN
INTRAVENOUS | Status: DC | PRN
  Administered 2019-12-21 (×5): 100 ug via INTRAVENOUS

## 2019-12-21 NOTE — Op Note (Signed)
[facility] Gastroenterology Patient Name: [name] Procedure Date: 12/21/2019 9:03 AM MRN: [id number] Account #: [id number] Date of Birth: Jul 13, 1945 Admit Type: Outpatient Age: 74 Room: [facility] ENDO ROOM 3 Gender: Female Note Status: Finalized Procedure:             Upper GI endoscopy Indications:           Epigastric abdominal pain, Esophageal varices, Nausea                         with vomiting Providers:             Andrey Farmer MD, MD Medicines:             Monitored Anesthesia Care Complications:         No immediate complications. Procedure:             Pre-Anesthesia Assessment:                        - Prior to the procedure, a History and Physical was                         performed, and patient medications and allergies were                         reviewed. The patient is competent. The risks and                         benefits of the procedure and the sedation options and                         risks were discussed with the patient. All questions                         were answered and informed consent was obtained.                         Patient identification and proposed procedure were                         verified by the physician, the nurse, the anesthetist                         and the technician in the endoscopy suite. Mental                         Status Examination: alert and oriented. Airway                         Examination: normal oropharyngeal airway and neck                         mobility. Respiratory Examination: clear to                         auscultation. CV Examination: normal. Prophylactic                         Antibiotics: The patient does not require prophylactic  antibiotics. Prior Anticoagulants: The patient has                         taken no previous anticoagulant or antiplatelet                         agents. ASA Grade Assessment: III - A patient with                          severe systemic disease. After reviewing the risks and                         benefits, the patient was deemed in satisfactory                         condition to undergo the procedure. The anesthesia                         plan was to use monitored anesthesia care (MAC).                         Immediately prior to administration of medications,                         the patient was re-assessed for adequacy to receive                         sedatives. The heart rate, respiratory rate, oxygen                         saturations, blood pressure, adequacy of pulmonary                         ventilation, and response to care were monitored                         throughout the procedure. The physical status of the                         patient was re-assessed after the procedure.                        After obtaining informed consent, the endoscope was                         passed under direct vision. Throughout the procedure,                         the patient's blood pressure, pulse, and oxygen                         saturations were monitored continuously. The Endoscope                         was introduced through the mouth, and advanced to the                         second part of duodenum. The upper GI endoscopy was  accomplished without difficulty. The patient tolerated                         the procedure well. Findings:      The examined esophagus was normal.      There is no endoscopic evidence of varices in the entire esophagus.      The entire examined stomach was normal.      The examined duodenum was normal. Impression:            - Normal esophagus.                        - Normal stomach.                        - Normal examined duodenum.                        - No specimens collected. Recommendation:        - Discharge patient to home.                        - Resume previous diet.                        - Continue present  medications.                        - Return to referring physician as previously                         scheduled. Procedure Code(s):     --- Professional ---                        [phone], Esophagogastroduodenoscopy, flexible,                         transoral; diagnostic, including collection of                         specimen(s) by brushing or washing, when performed                         (separate procedure) Diagnosis Code(s):     --- Professional ---                        R10.13, Epigastric pain                        I85.00, Esophageal varices without bleeding                        R11.2, Nausea with vomiting, unspecified CPT copyright 2019 American Medical Association. All rights reserved. The codes documented in this report are preliminary and upon coder review may  be revised to meet current compliance requirements. Andrey Farmer, MD Andrey Farmer MD, MD 12/21/2019 9:52:37 AM Number of Addenda: 0 Note Initiated On: 12/21/2019 9:03 AM      [facility]

## 2019-12-21 NOTE — Interval H&P Note (Signed)
History and Physical Interval Note:  12/21/2019 9:09 AM  [name]  has presented today for surgery, with the diagnosis of PORTAL HYPERTENSION,FAMILY HX.OF POLYPS.  The various methods of treatment have been discussed with the patient and family. After consideration of risks, benefits and other options for treatment, the patient has consented to  Procedure(s): ESOPHAGOGASTRODUODENOSCOPY (EGD) WITH PROPOFOL (N/A) COLONOSCOPY WITH PROPOFOL (N/A) as a surgical intervention.  The patient's history has been reviewed, patient examined, no change in status, stable for surgery.  I have reviewed the patient's chart and labs.  Questions were answered to the patient's satisfaction.     Lesly Rubenstein  Ok to proceed with EGD/Colonoscopy

## 2019-12-21 NOTE — Op Note (Addendum)
[facility] Gastroenterology Patient Name: [name] Procedure Date: 12/21/2019 9:03 AM MRN: [id number] Account #: [id number] Date of Birth: 12/15/1945 Admit Type: Outpatient Age: 74 Room: [facility] ENDO ROOM 3 Gender: Female Note Status: Supervisor Override Procedure:             Colonoscopy Indications:           Screening for colon cancer: Family history of colon                         polyps in distant relative(s), Family history of                         gastrointestinal tract cancer in a first-degree                         relative Providers:             Andrey Farmer MD, MD Medicines:             Monitored Anesthesia Care Complications:         No immediate complications. Estimated blood loss:                         Minimal. Procedure:             Pre-Anesthesia Assessment:                        - Prior to the procedure, a History and Physical was                         performed, and patient medications and allergies were                         reviewed. The patient is competent. The risks and                         benefits of the procedure and the sedation options and                         risks were discussed with the patient. All questions                         were answered and informed consent was obtained.                         Patient identification and proposed procedure were                         verified by the physician, the nurse, the anesthetist                         and the technician in the endoscopy suite. Mental                         Status Examination: alert and oriented. Airway                         Examination: normal oropharyngeal airway and neck  mobility. Respiratory Examination: clear to                         auscultation. CV Examination: normal. Prophylactic                         Antibiotics: The patient does not require prophylactic                         antibiotics. Prior  Anticoagulants: The patient has                         taken no previous anticoagulant or antiplatelet                         agents. ASA Grade Assessment: III - A patient with                         severe systemic disease. After reviewing the risks and                         benefits, the patient was deemed in satisfactory                         condition to undergo the procedure. The anesthesia                         plan was to use monitored anesthesia care (MAC).                         Immediately prior to administration of medications,                         the patient was re-assessed for adequacy to receive                         sedatives. The heart rate, respiratory rate, oxygen                         saturations, blood pressure, adequacy of pulmonary                         ventilation, and response to care were monitored                         throughout the procedure. The physical status of the                         patient was re-assessed after the procedure.                        After obtaining informed consent, the colonoscope was                         passed under direct vision. Throughout the procedure,                         the patient's blood pressure, pulse, and oxygen  saturations were monitored continuously. The                         Colonoscope was introduced through the anus and                         advanced to the the cecum, identified by appendiceal                         orifice and ileocecal valve. The colonoscopy was                         performed without difficulty. The patient tolerated                         the procedure well. The quality of the bowel                         preparation was good. Findings:      The perianal and digital rectal examinations were normal.      Multiple small-mouthed diverticula were found in the sigmoid colon,       descending colon, transverse colon, ascending colon and  cecum.      The ileocecal valve was moderately lipomatous. Biopsies were taken with       a cold forceps for histology. Estimated blood loss was minimal.      A 1 mm polyp was found in the recto-sigmoid colon. The polyp was       sessile. The polyp was removed with a jumbo cold forceps. Resection and       retrieval were complete. Estimated blood loss was minimal.      The exam was otherwise without abnormality on direct and retroflexion       views. Impression:            - Diverticulosis in the sigmoid colon, in the                         descending colon, in the transverse colon, in the                         ascending colon and in the cecum.                        - Lipomatous ileocecal valve. Biopsied.                        - One 1 mm polyp at the recto-sigmoid colon, removed                         with a jumbo cold forceps. Resected and retrieved.                        - The examination was otherwise normal on direct and                         retroflexion views. Recommendation:        - Discharge patient to home.                        -  Resume previous diet.                        - Continue present medications.                        - Await pathology results.                        - Repeat colonoscopy for surveillance based on                         pathology results.                        - Return to referring physician as previously                         scheduled. Procedure Code(s):     --- Professional ---                        [id number], Colonoscopy, flexible; with biopsy, single or                         multiple Diagnosis Code(s):     --- Professional ---                        Z12.11, Encounter for screening for malignant neoplasm                         of colon                        Z83.71, Family history of colonic polyps                        K63.89, Other specified diseases of intestine                        K63.5, Polyp of colon                         Z80.0, Family history of malignant neoplasm of                         digestive organs                        K57.30, Diverticulosis of large intestine without                         perforation or abscess without bleeding CPT copyright 2019 [employer]. All rights reserved. The codes documented in this report are preliminary and upon coder review may  be revised to meet current compliance requirements. Andrey Farmer, MD Andrey Farmer MD, MD 12/21/2019 9:57:49 AM Number of Addenda: 0 Note Initiated On: 12/21/2019 9:03 AM Scope Withdrawal Time: 0 hours 10 minutes 49 seconds  Total Procedure Duration: 0 hours 16 minutes 37 seconds  Estimated Blood Loss:  Estimated blood loss was minimal.      [facility]

## 2019-12-21 NOTE — Anesthesia Preprocedure Evaluation (Signed)
Anesthesia Evaluation  Patient identified by MRN, date of birth, ID band Patient awake    Reviewed: Allergy & Precautions, NPO status , Patient's Chart, lab work & pertinent test results  History of Anesthesia Complications Negative for: history of anesthetic complications  Airway Mallampati: III       Dental   Pulmonary neg sleep apnea, neg COPD, Not current smoker,           Cardiovascular hypertension, Pt. on medications (-) Past MI and (-) CHF (-) dysrhythmias (-) Valvular Problems/Murmurs     Neuro/Psych neg Seizures    GI/Hepatic Neg liver ROS, neg GERD  ,  Endo/Other  diabetes, Type 2, Oral Hypoglycemic Agents  Renal/GU negative Renal ROS     Musculoskeletal   Abdominal   Peds  Hematology   Anesthesia Other Findings   Reproductive/Obstetrics                             Anesthesia Physical Anesthesia Plan  ASA: II  Anesthesia Plan: General   Post-op Pain Management:    Induction: Intravenous  PONV Risk Score and Plan: 3 and Propofol infusion, TIVA and Treatment may vary due to age or medical condition  Airway Management Planned: Nasal Cannula  Additional Equipment:   Intra-op Plan:   Post-operative Plan:   Informed Consent: I have reviewed the patients History and Physical, chart, labs and discussed the procedure including the risks, benefits and alternatives for the proposed anesthesia with the patient or authorized representative who has indicated his/her understanding and acceptance.       Plan Discussed with:   Anesthesia Plan Comments:         Anesthesia Quick Evaluation

## 2019-12-21 NOTE — Anesthesia Postprocedure Evaluation (Signed)
Anesthesia Post Note  Patient: [name]  Procedure(s) Performed: ESOPHAGOGASTRODUODENOSCOPY (EGD) WITH PROPOFOL (N/A ) COLONOSCOPY WITH PROPOFOL (N/A )  Patient location during evaluation: Endoscopy Anesthesia Type: General Level of consciousness: awake and alert Pain management: pain level controlled Vital Signs Assessment: post-procedure vital signs reviewed and stable Respiratory status: spontaneous breathing and respiratory function stable Cardiovascular status: stable Anesthetic complications: no   No complications documented.   Last Vitals:  Vitals:   12/21/19 1017 12/21/19 1027  BP: (!) 90/57 (!) 108/59  Pulse: 67 69  Resp: 15 (!) 21  Temp:    SpO2: 100% 100%    Last Pain:  Vitals:   12/21/19 1027  TempSrc:   PainSc: 0-No pain                 Sarye Kath K

## 2019-12-21 NOTE — Transfer of Care (Signed)
Immediate Anesthesia Transfer of Care Note  Patient: [name]  Procedure(s) Performed: ESOPHAGOGASTRODUODENOSCOPY (EGD) WITH PROPOFOL (N/A ) COLONOSCOPY WITH PROPOFOL (N/A )  Patient Location: Endoscopy Unit  Anesthesia Type:General  Level of Consciousness: drowsy  Airway & Oxygen Therapy: Patient Spontanous Breathing  Post-op Assessment: Report given to RN and Post -op Vital signs reviewed and stable  Post vital signs: Reviewed and stable  Last Vitals:  Vitals Value Taken Time  BP 97/53 12/21/19 0948  Temp 36.6 C 12/21/19 0947  Pulse 72 12/21/19 0950  Resp 19 12/21/19 0950  SpO2 99 % 12/21/19 0950  Vitals shown include unvalidated device data.  Last Pain:  Vitals:   12/21/19 0947  TempSrc:   PainSc: Asleep         Complications: No complications documented.

## 2019-12-21 NOTE — H&P (Signed)
Outpatient short stay form Pre-procedure 12/21/2019 9:07 AM Raylene Miyamoto MD, MPH  Primary Physician: Dr. 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 No family history of GI malignancies. No blood thinners. No abdominal surgeries.    Current Facility-Administered Medications:  .  0.9 %  sodium chloride infusion, , Intravenous, Continuous, Mykai Wendorf, Hilton Cork, MD, Last Rate: 20 mL/hr at 12/21/19 0902, Continued from Pre-op at 12/21/19 0902  Medications Prior to Admission  Medication Sig Dispense Refill Last Dose  . aspirin 81 MG EC tablet Take 81 mg by mouth daily. Swallow whole.   Past Week at Unknown time  . atorvastatin (LIPITOR) 10 MG tablet Take 10 mg by mouth daily.  11 12/20/2019 at Unknown time  . Dulaglutide (TRULICITY) 2.83 MO/2.9UT SOPN Inject 0.75 mg into the skin every 7 (seven) days.   Past Week at Unknown time  . glipiZIDE (GLUCOTROL XL) 10 MG 24 hr tablet Take 1 tablet (10 mg total) by mouth 2 (two) times daily. 60 tablet 0 Past Week at Unknown time  . hydrochlorothiazide (HYDRODIURIL) 25 MG tablet Take 25 mg by mouth daily.   Past Week at Unknown time  . insulin NPH-regular Human (NOVOLIN 70/30) (70-30) 100 UNIT/ML injection Inject 80 Units into the skin every morning. 50 units daily- evenings   12/20/2019 at Unknown time  . metFORMIN (GLUCOPHAGE) 500 MG tablet Take by mouth 2 (two) times daily with a meal.   12/20/2019 at Unknown time  . telmisartan (MICARDIS) 40 MG tablet Take 40 mg by mouth daily.   12/20/2019 at Unknown time  . insulin aspart protamine- aspart (NOVOLOG MIX 70/30) (70-30) 100 UNIT/ML injection Inject 0.65 mLs (65 Units total) into the skin 2 times daily at 12 noon and 4 pm. 39 mL 0      Allergies  Allergen Reactions  . Ace Inhibitors Cough and Other (See  Comments)     Past Medical History:  Diagnosis Date  . Diabetes mellitus without complication ([place])   . Fatty liver   . Heart murmur   . Hypergammaglobulinemia   . Hyperlipidemia   . Hypertension   . Osteopenia   . Pure hypercholesterolemia   . Thyroid disease   . Vitamin D deficiency     Review of systems:  Otherwise negative.    Physical Exam  Gen: Alert, oriented. Appears stated age.  HEENT: [place]/AT. PERRLA. Lungs: CTA, no wheezes. CV: RR nl S1, S2. Abd: soft, benign, no masses. BS+ Ext: No edema. Pulses 2+    Planned procedures: Proceed with EGD/colonoscopy. The patient understands the nature of the planned procedure, indications, risks, alternatives and potential complications including but not limited to bleeding, infection, perforation, damage to internal organs and possible oversedation/side effects from anesthesia. The patient agrees and gives consent to proceed.  Please refer to procedure notes for findings, recommendations and patient disposition/instructions.     Raylene Miyamoto MD, MPH Gastroenterology 12/21/2019  9:07 AM

## 2019-12-23 ENCOUNTER — Encounter: Payer: Self-pay | Admitting: Gastroenterology

## 2019-12-25 LAB — SURGICAL PATHOLOGY

## 2019-12-26 ENCOUNTER — Other Ambulatory Visit: Payer: Self-pay | Admitting: Student

## 2019-12-26 DIAGNOSIS — K766 Portal hypertension: Secondary | ICD-10-CM

## 2019-12-26 DIAGNOSIS — R7401 Elevation of levels of liver transaminase levels: Secondary | ICD-10-CM

## 2019-12-26 DIAGNOSIS — K760 Fatty (change of) liver, not elsewhere classified: Secondary | ICD-10-CM

## 2020-01-14 ENCOUNTER — Ambulatory Visit
Admission: RE | Admit: 2020-01-14 | Discharge: 2020-01-14 | Disposition: A | Discharge status: Home / Self Care | Payer: Medicare Other | Source: Ambulatory Visit | Attending: Student | Admitting: Student

## 2020-01-14 ENCOUNTER — Other Ambulatory Visit: Payer: Self-pay

## 2020-01-14 DIAGNOSIS — K766 Portal hypertension: Secondary | ICD-10-CM | POA: Diagnosis present

## 2020-01-14 DIAGNOSIS — R7401 Elevation of levels of liver transaminase levels: Secondary | ICD-10-CM | POA: Insufficient documentation

## 2020-01-14 DIAGNOSIS — K760 Fatty (change of) liver, not elsewhere classified: Secondary | ICD-10-CM | POA: Insufficient documentation

## 2020-06-27 ENCOUNTER — Other Ambulatory Visit: Payer: Self-pay | Admitting: Gastroenterology

## 2020-06-27 DIAGNOSIS — R748 Abnormal levels of other serum enzymes: Secondary | ICD-10-CM

## 2020-06-27 DIAGNOSIS — K760 Fatty (change of) liver, not elsewhere classified: Secondary | ICD-10-CM

## 2020-07-14 ENCOUNTER — Ambulatory Visit
Admission: RE | Admit: 2020-07-14 | Discharge: 2020-07-14 | Disposition: A | Discharge status: Home / Self Care | Payer: Medicare Other | Source: Ambulatory Visit | Attending: Gastroenterology | Admitting: Gastroenterology

## 2020-07-14 ENCOUNTER — Other Ambulatory Visit: Payer: Self-pay

## 2020-07-14 DIAGNOSIS — R748 Abnormal levels of other serum enzymes: Secondary | ICD-10-CM | POA: Insufficient documentation

## 2020-07-14 DIAGNOSIS — K760 Fatty (change of) liver, not elsewhere classified: Secondary | ICD-10-CM | POA: Insufficient documentation

## 2020-12-11 ENCOUNTER — Other Ambulatory Visit: Payer: Self-pay | Admitting: Family Medicine

## 2020-12-11 DIAGNOSIS — Z1231 Encounter for screening mammogram for malignant neoplasm of breast: Secondary | ICD-10-CM

## 2020-12-18 ENCOUNTER — Other Ambulatory Visit: Payer: Self-pay

## 2020-12-18 ENCOUNTER — Ambulatory Visit
Admission: RE | Admit: 2020-12-18 | Discharge: 2020-12-18 | Disposition: A | Discharge status: Home / Self Care | Payer: Medicare Other | Source: Ambulatory Visit | Attending: Family Medicine | Admitting: Family Medicine

## 2020-12-18 ENCOUNTER — Ambulatory Visit: Payer: Medicare Other

## 2020-12-18 DIAGNOSIS — Z1231 Encounter for screening mammogram for malignant neoplasm of breast: Secondary | ICD-10-CM | POA: Diagnosis present

## 2021-08-08 ENCOUNTER — Other Ambulatory Visit: Payer: Self-pay | Admitting: Family Medicine

## 2021-08-08 DIAGNOSIS — K760 Fatty (change of) liver, not elsewhere classified: Secondary | ICD-10-CM

## 2021-08-20 ENCOUNTER — Other Ambulatory Visit: Payer: Self-pay | Admitting: Gastroenterology

## 2021-08-20 DIAGNOSIS — K760 Fatty (change of) liver, not elsewhere classified: Secondary | ICD-10-CM

## 2021-08-20 DIAGNOSIS — R7401 Elevation of levels of liver transaminase levels: Secondary | ICD-10-CM

## 2021-09-03 ENCOUNTER — Other Ambulatory Visit: Payer: Medicare Other

## 2021-09-04 ENCOUNTER — Ambulatory Visit
Admission: RE | Admit: 2021-09-04 | Discharge: 2021-09-04 | Disposition: A | Discharge status: Home / Self Care | Payer: Medicare Other | Source: Ambulatory Visit | Attending: Gastroenterology | Admitting: Gastroenterology

## 2021-09-04 ENCOUNTER — Ambulatory Visit: Payer: Medicare Other

## 2021-09-04 DIAGNOSIS — R7401 Elevation of levels of liver transaminase levels: Secondary | ICD-10-CM | POA: Diagnosis present

## 2021-09-04 DIAGNOSIS — K760 Fatty (change of) liver, not elsewhere classified: Secondary | ICD-10-CM | POA: Diagnosis not present

## 2021-11-19 ENCOUNTER — Other Ambulatory Visit: Payer: Self-pay | Admitting: Family Medicine

## 2021-11-19 DIAGNOSIS — Z1231 Encounter for screening mammogram for malignant neoplasm of breast: Secondary | ICD-10-CM

## 2021-12-17 IMAGING — MG DIGITAL SCREENING BILAT W/ TOMO W/ CAD
8 series · 8 of 24 positions shown · non-contrast
Comparison: Previous exam(s).

CLINICAL DATA: Screening.

EXAM:
DIGITAL SCREENING BILATERAL MAMMOGRAM WITH TOMO AND CAD

[L MLO synth-2D]
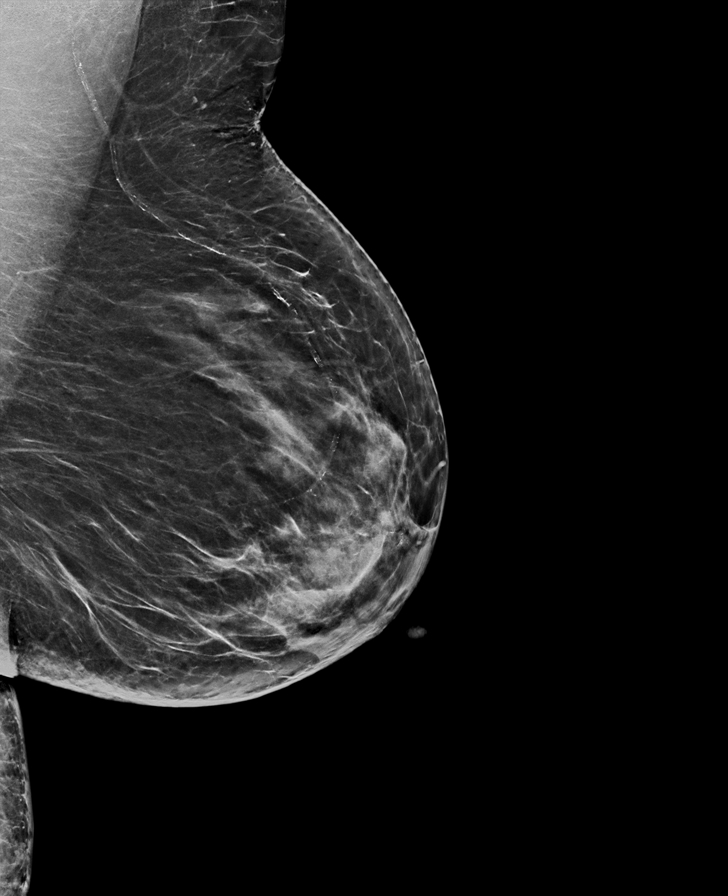

[R MLO synth-2D]
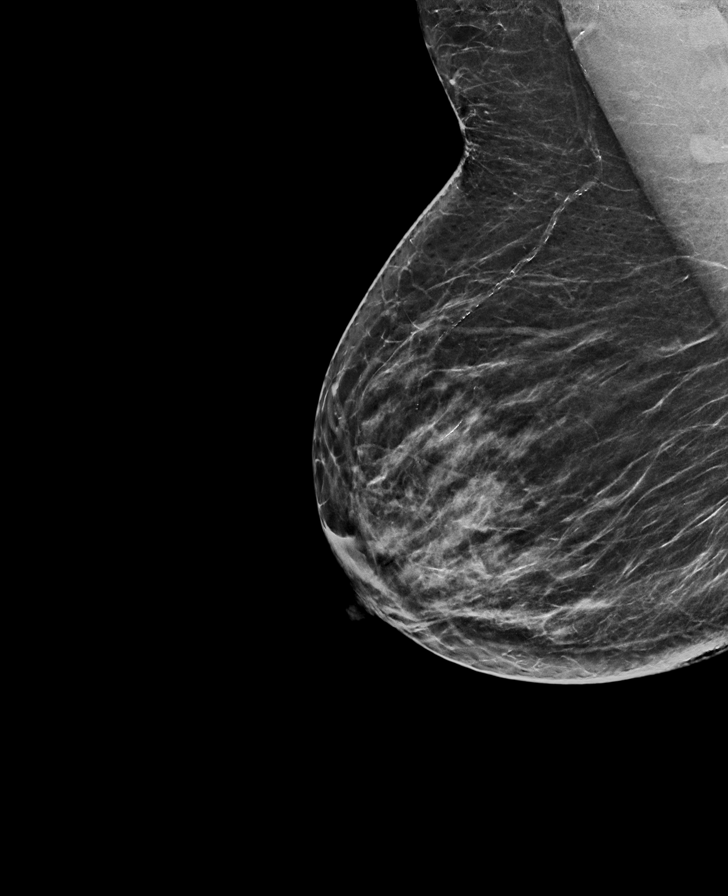

[R CC synth-2D]
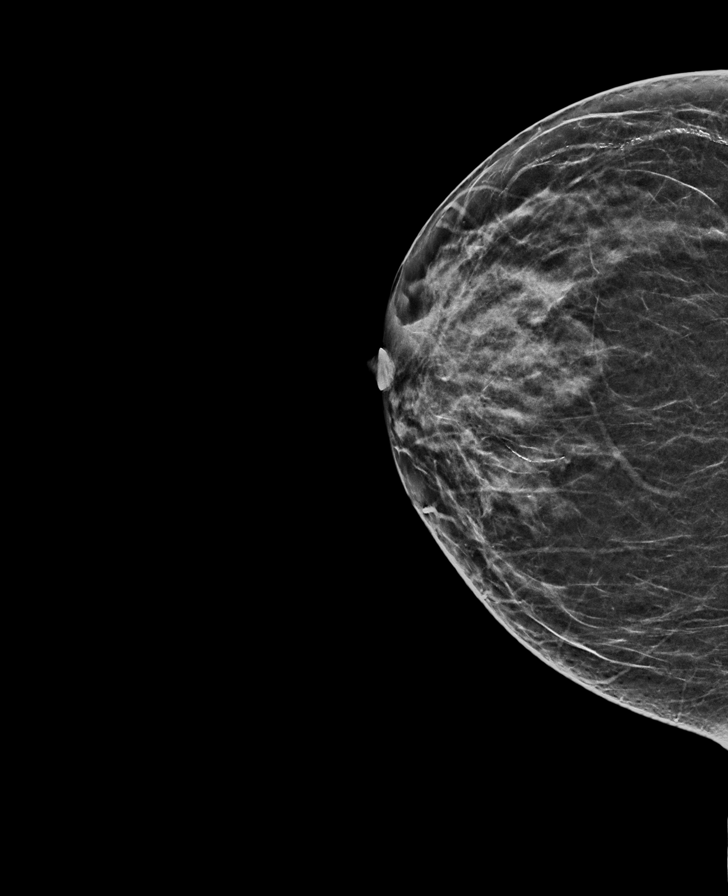

[L CC synth-2D]
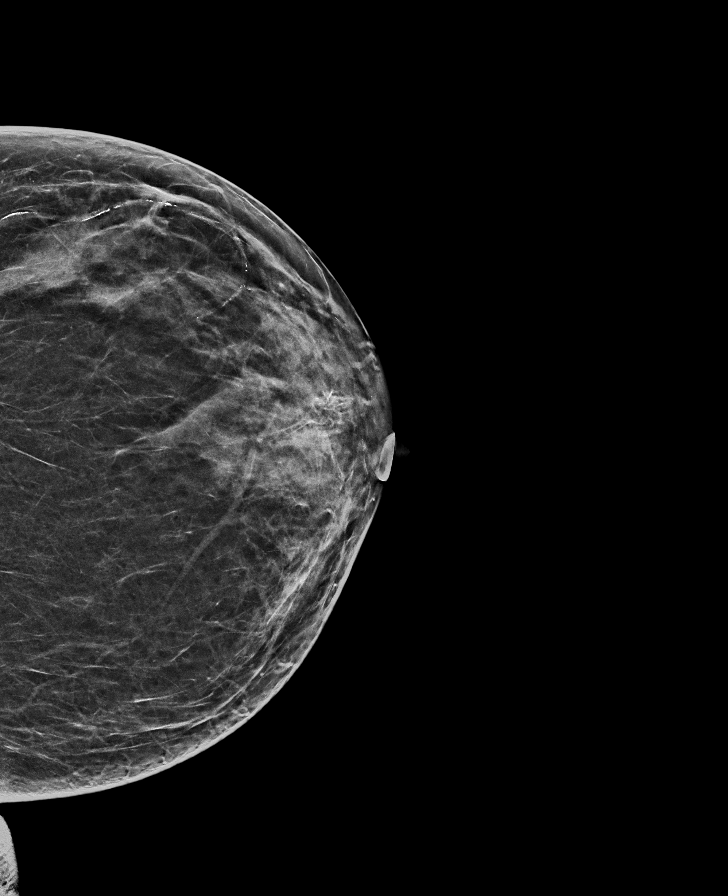

[L MLO tomo · tomo slice 37/72.0]
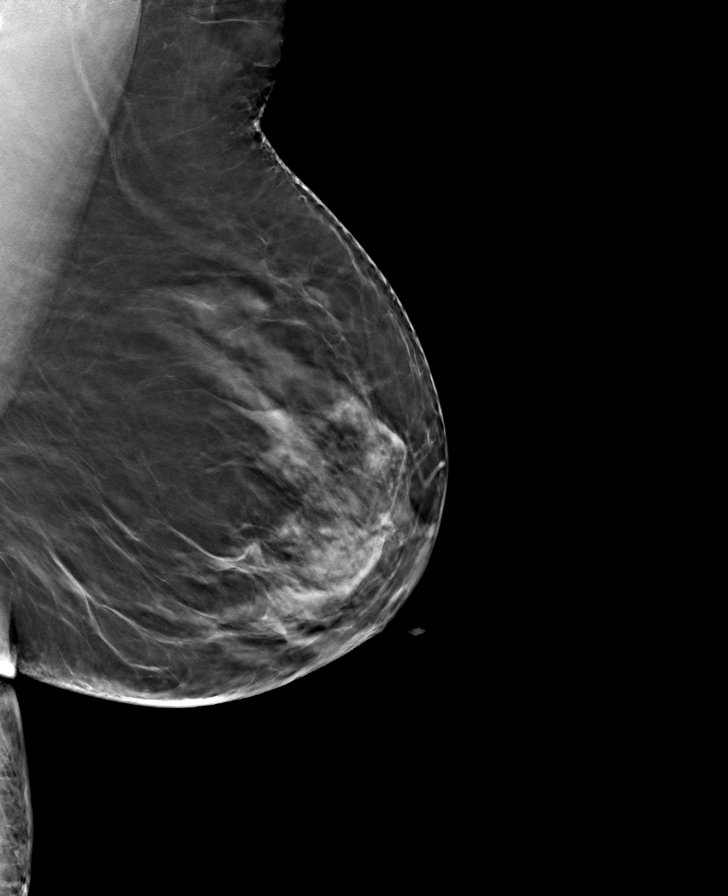

[R CC tomo · tomo slice 27/54.0]
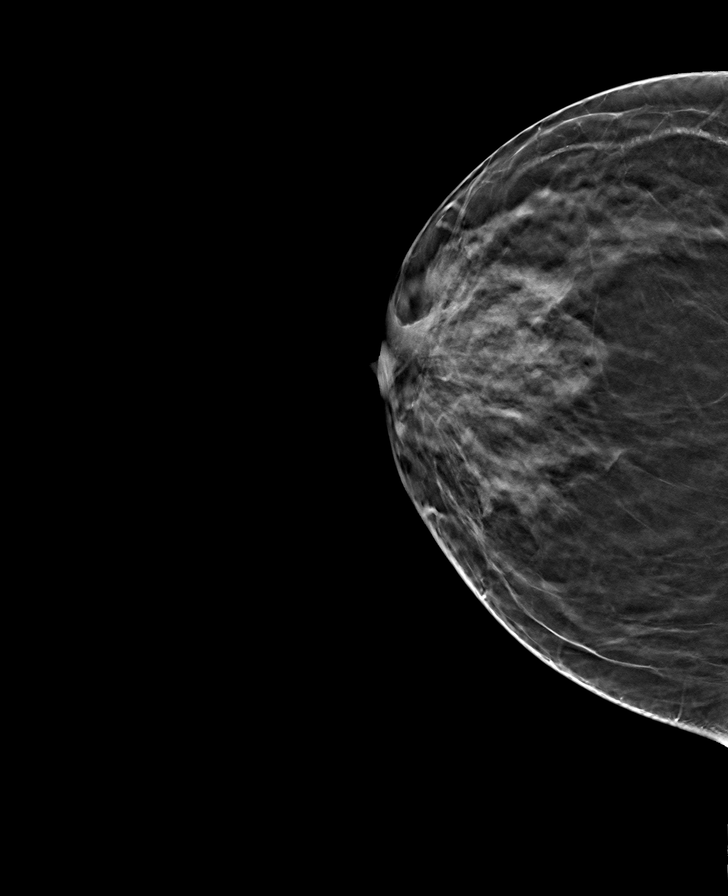

[R MLO tomo · tomo slice 35/69.0]
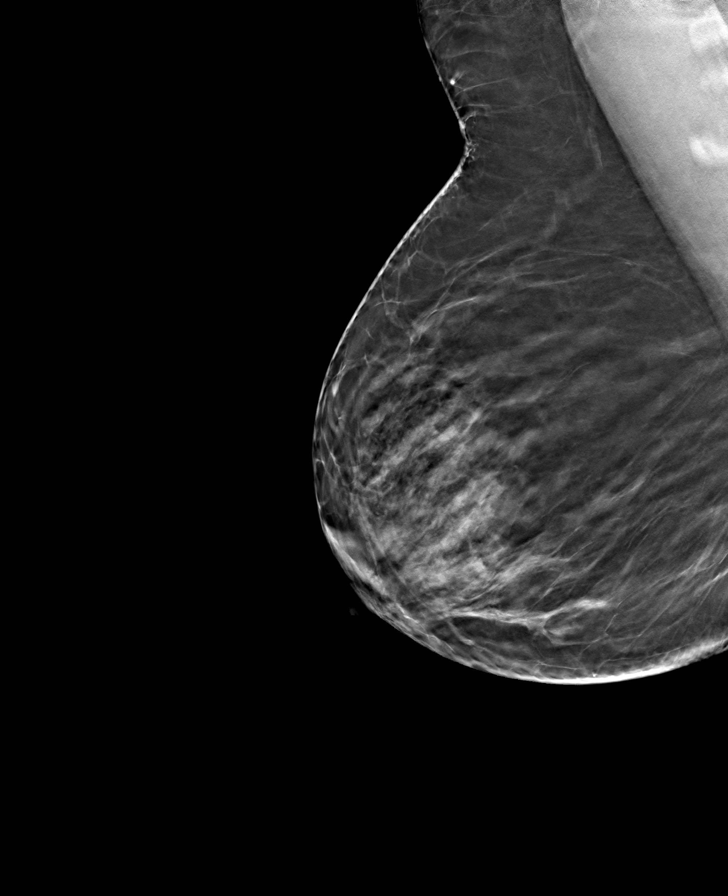

[L CC tomo · tomo slice 29/58.0]
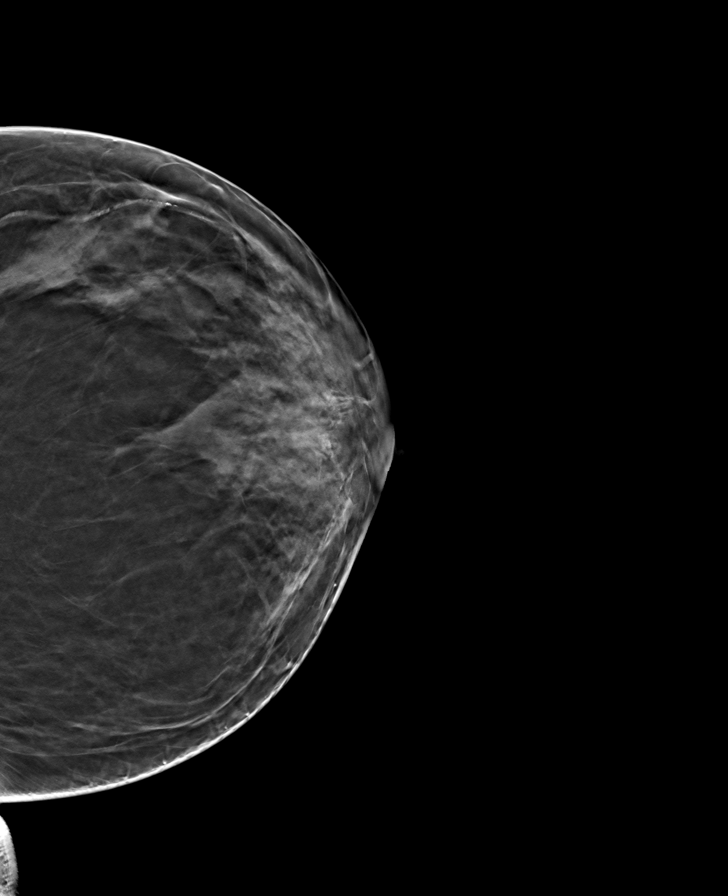

[8 of 24 positions shown; findings below may reference images not displayed]

ACR Breast Density Category c: The breast tissue is heterogeneously
dense, which may obscure small masses.
FINDINGS: There are no findings suspicious for malignancy. Images were
processed with CAD.
IMPRESSION: No mammographic evidence of malignancy. A result letter of this
screening mammogram will be mailed directly to the patient.

RECOMMENDATION:
Screening mammogram in one year. (Code:FT-U-LHB)

BI-RADS CATEGORY  1: Negative.

## 2021-12-23 ENCOUNTER — Ambulatory Visit
Admission: RE | Admit: 2021-12-23 | Discharge: 2021-12-23 | Disposition: A | Discharge status: Home / Self Care | Payer: Medicare Other | Source: Ambulatory Visit | Attending: Family Medicine | Admitting: Family Medicine

## 2021-12-23 DIAGNOSIS — Z1231 Encounter for screening mammogram for malignant neoplasm of breast: Secondary | ICD-10-CM | POA: Insufficient documentation

## 2022-11-17 IMAGING — US US ABDOMEN COMPLETE
1 series · 14 of 25 positions shown · non-contrast
Comparison: Ultrasound abdomen 01/14/2020

CLINICAL DATA: Elevated liver enzymes

EXAM:
ABDOMEN ULTRASOUND COMPLETE

[Series 1: us abdomen complete · 0.19mm/px · 14 of 95 slices shown]
[im 1/95]
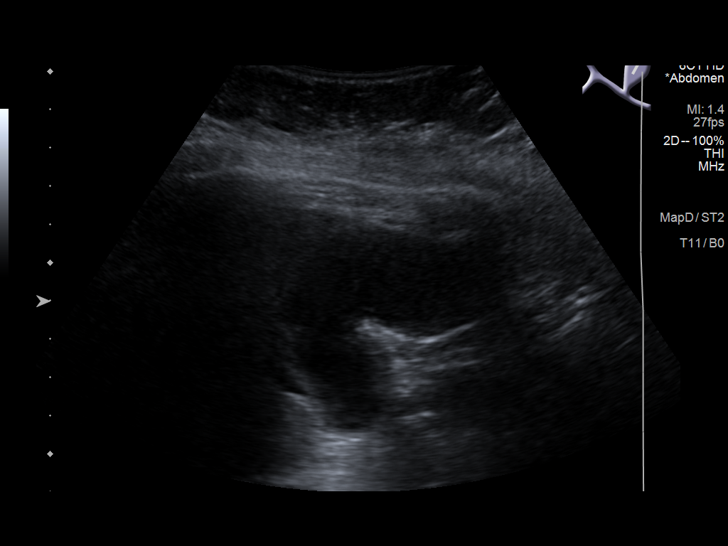
[im 8/95]
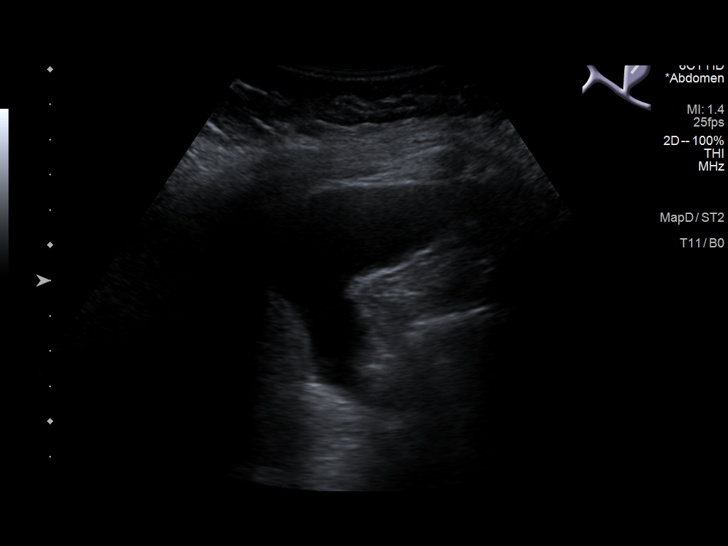
[im 16/95]
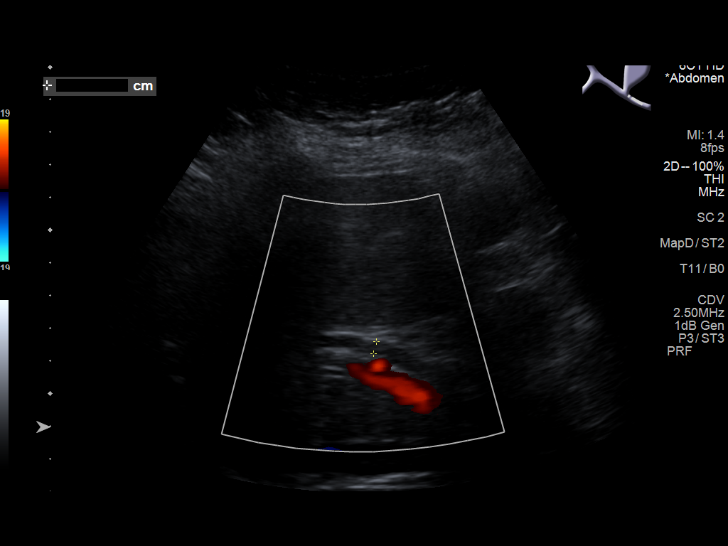
[im 24/95]
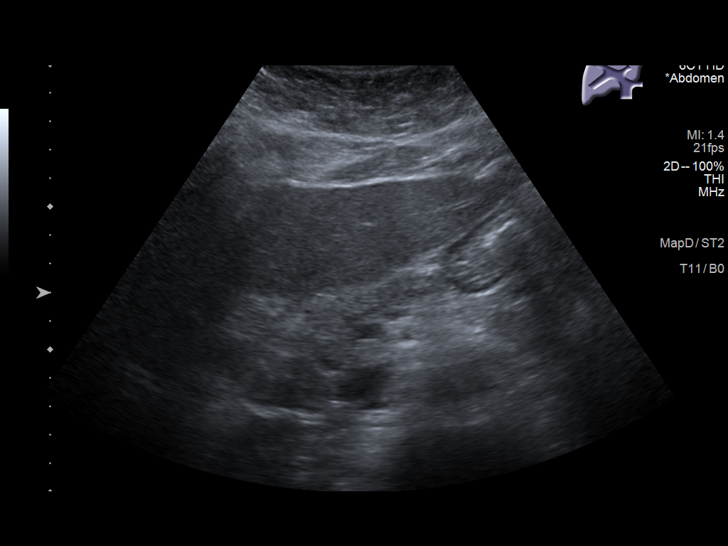
[im 32/95]
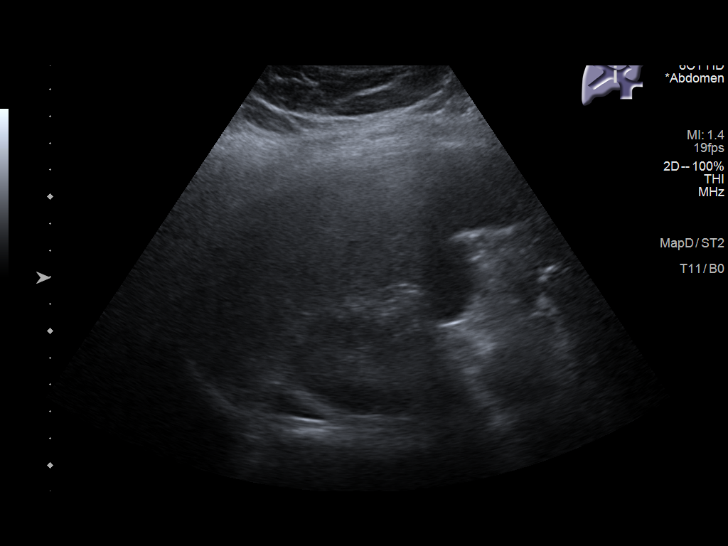
[im 36/95]
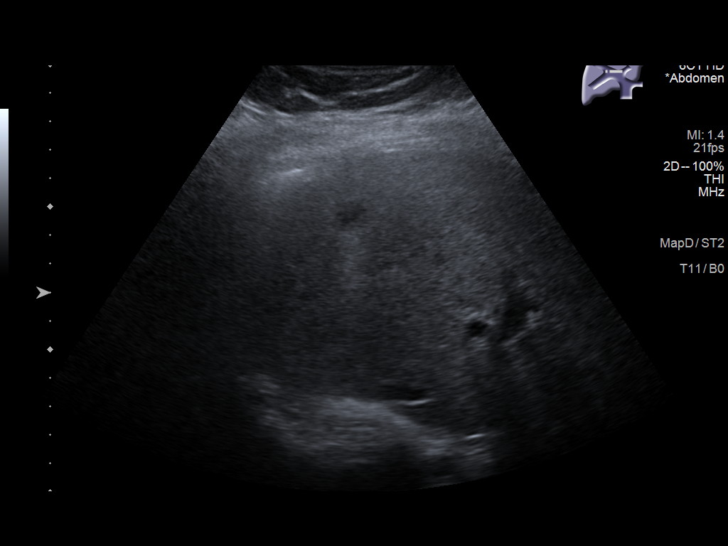
[im 44/95]
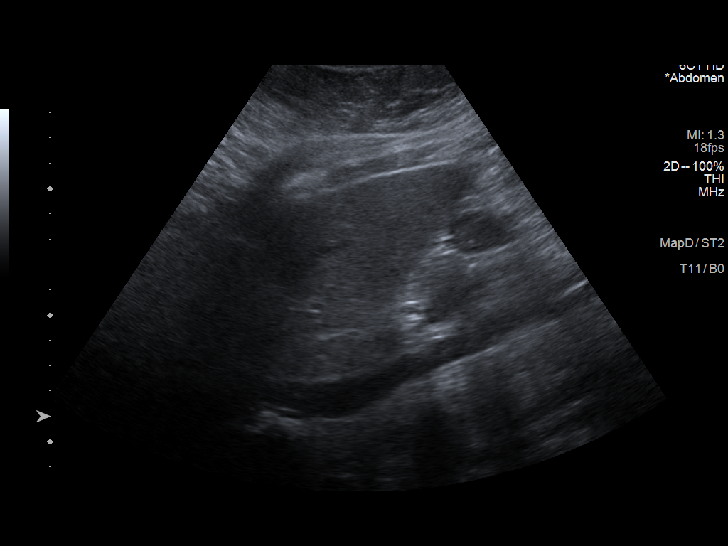
[im 51/95]
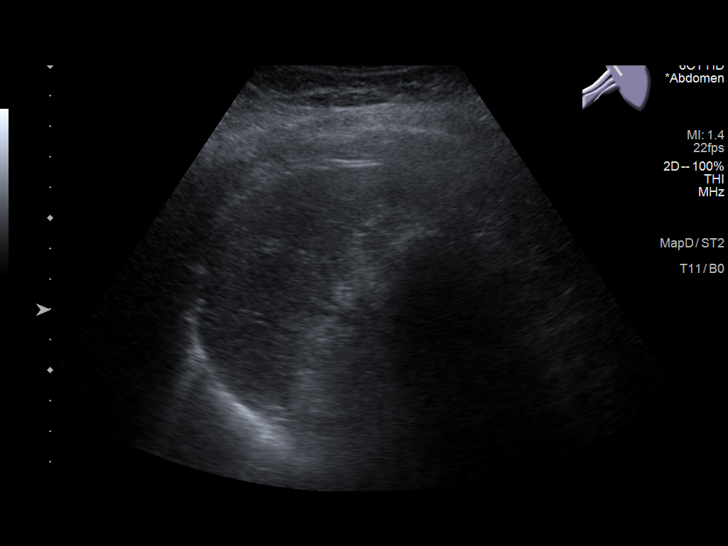
[im 59/95]
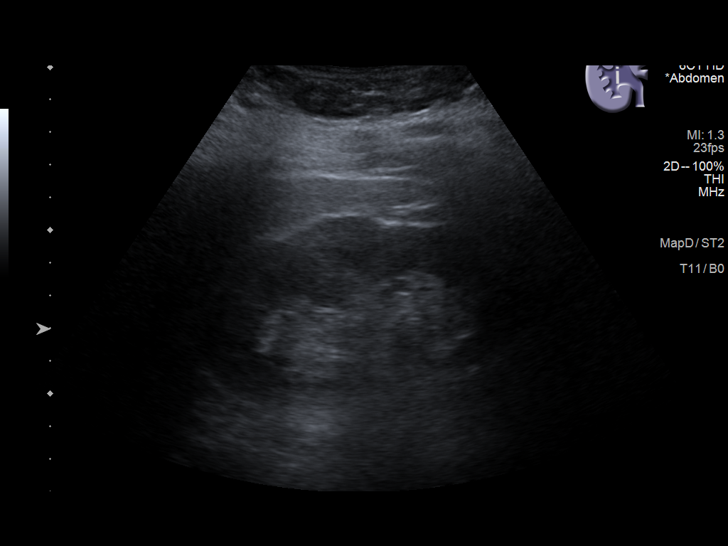
[im 63/95]
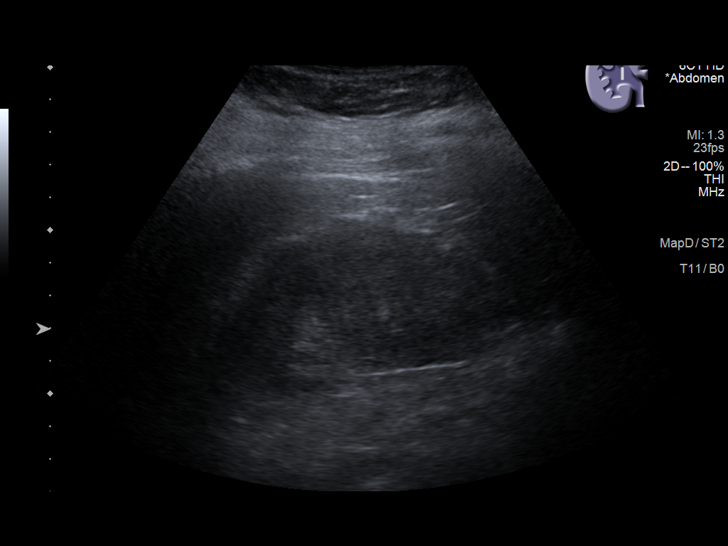
[im 71/95]
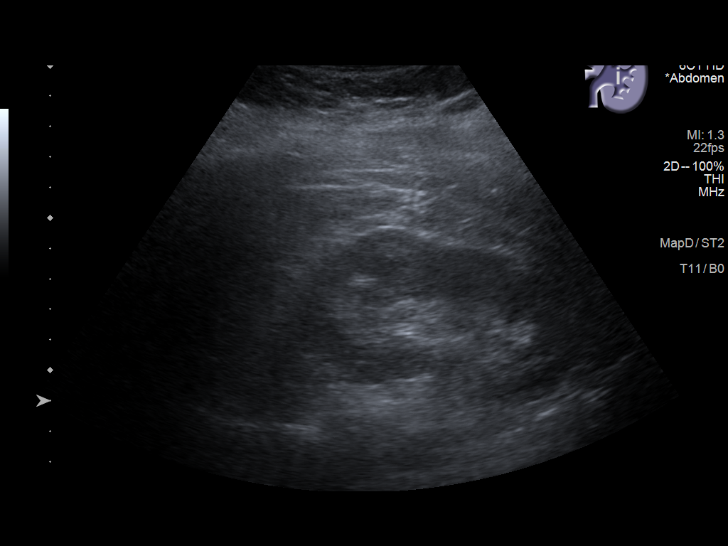
[im 79/95]
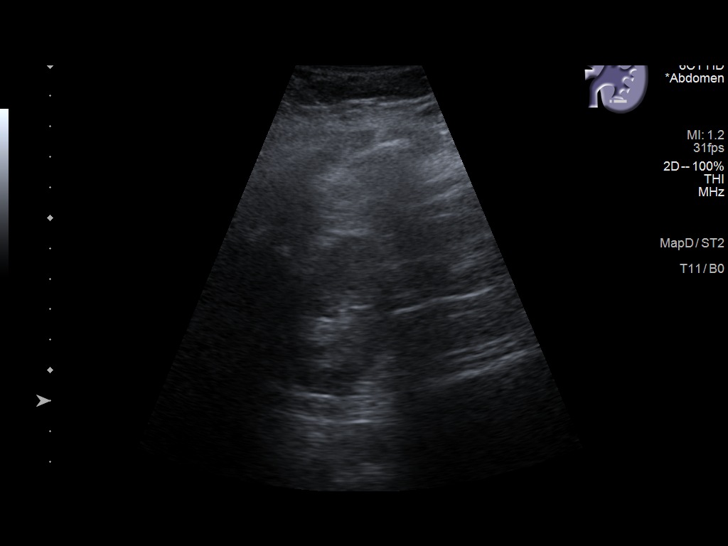
[im 87/95]
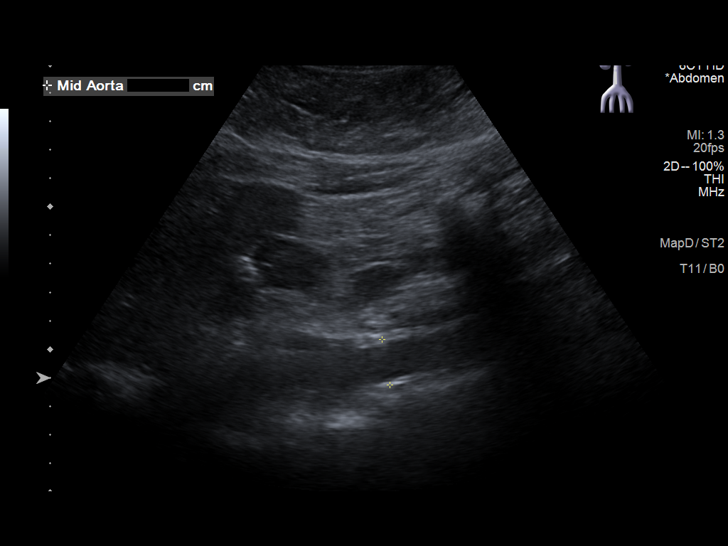
[im 95/95]
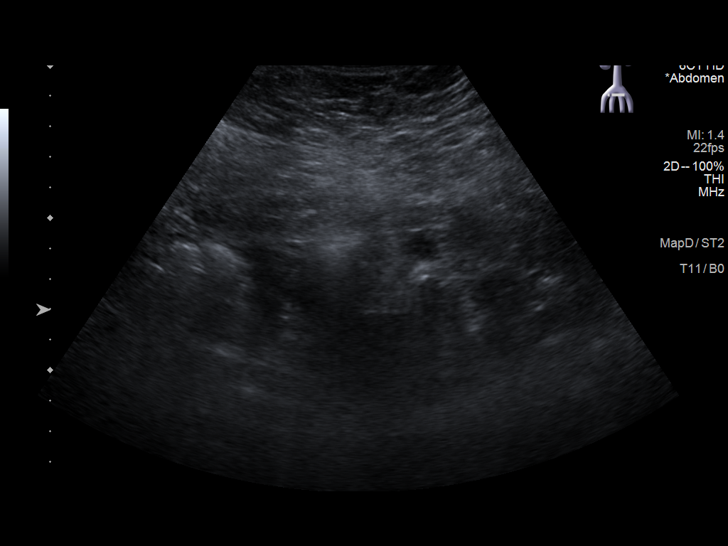

[14 of 25 positions shown; findings below may reference images not displayed]

FINDINGS: Gallbladder: No gallstones or wall thickening visualized. No
sonographic Murphy sign noted by sonographer.

Common bile duct: Diameter: 4 mm

Liver: 1.5 cm simple cyst again seen in the right liver lobe. No
suspicious liver lesions. There is diffuse increased echogenicity of
the hepatic parenchyma. Portal vein is patent on color Doppler
imaging with normal direction of blood flow towards the liver.

IVC: No abnormality visualized.

Pancreas: Visualized portion unremarkable.

Spleen: Size and appearance within normal limits.

Right Kidney: Length: 10.3 cm. Echogenicity within normal limits. No
mass or hydronephrosis visualized.

Left Kidney: Length: 10.0 cm. Echogenicity within normal limits. No
mass or hydronephrosis visualized.

Abdominal aorta: No aneurysm visualized.

Other findings: None.
IMPRESSION: Diffuse increased echogenicity of the hepatic parenchyma is a
nonspecific indicator of hepatocellular dysfunction, most commonly
steatosis.

## 2023-04-21 ENCOUNTER — Other Ambulatory Visit: Payer: Self-pay | Admitting: Family Medicine

## 2023-04-21 DIAGNOSIS — K760 Fatty (change of) liver, not elsewhere classified: Secondary | ICD-10-CM

## 2023-04-21 DIAGNOSIS — R7989 Other specified abnormal findings of blood chemistry: Secondary | ICD-10-CM

## 2023-09-28 ENCOUNTER — Ambulatory Visit
Admission: RE | Admit: 2023-09-28 | Discharge: 2023-09-28 | Disposition: A | Discharge status: Home / Self Care | Source: Ambulatory Visit | Attending: Family Medicine | Admitting: Family Medicine

## 2023-09-28 DIAGNOSIS — R7989 Other specified abnormal findings of blood chemistry: Secondary | ICD-10-CM | POA: Insufficient documentation

## 2023-09-28 DIAGNOSIS — K760 Fatty (change of) liver, not elsewhere classified: Secondary | ICD-10-CM | POA: Insufficient documentation

## 2023-10-07 ENCOUNTER — Other Ambulatory Visit: Payer: Self-pay | Admitting: Family Medicine

## 2023-10-07 DIAGNOSIS — K760 Fatty (change of) liver, not elsewhere classified: Secondary | ICD-10-CM

## 2024-02-28 ENCOUNTER — Other Ambulatory Visit: Payer: Self-pay | Admitting: Family Medicine

## 2024-02-28 DIAGNOSIS — Z1231 Encounter for screening mammogram for malignant neoplasm of breast: Secondary | ICD-10-CM

## 2024-02-28 DIAGNOSIS — Z780 Asymptomatic menopausal state: Secondary | ICD-10-CM

## 2024-05-03 ENCOUNTER — Ambulatory Visit
Admission: RE | Admit: 2024-05-03 | Discharge: 2024-05-03 | Disposition: A | Discharge status: Home / Self Care | Source: Ambulatory Visit | Attending: Family Medicine | Admitting: Family Medicine

## 2024-05-03 ENCOUNTER — Ambulatory Visit
Admission: RE | Admit: 2024-05-03 | Discharge: 2024-05-03 | Disposition: A | Source: Ambulatory Visit | Attending: Family Medicine | Admitting: Family Medicine

## 2024-05-03 DIAGNOSIS — Z780 Asymptomatic menopausal state: Secondary | ICD-10-CM | POA: Insufficient documentation

## 2024-05-03 DIAGNOSIS — Z1231 Encounter for screening mammogram for malignant neoplasm of breast: Secondary | ICD-10-CM | POA: Diagnosis present
